# Patient Record
Sex: Male | Born: 1935 | Race: White | Marital: Married | Smoking: Never smoker
Health system: Southern US, Community
[De-identification: ages and names within clinical notes are randomized; demographics above are authoritative.]

## PROBLEM LIST (undated history)

## (undated) DIAGNOSIS — F329 Major depressive disorder, single episode, unspecified: Secondary | ICD-10-CM

## (undated) DIAGNOSIS — G20 Parkinson's disease: Secondary | ICD-10-CM

## (undated) DIAGNOSIS — F32A Depression, unspecified: Secondary | ICD-10-CM

## (undated) DIAGNOSIS — Z638 Other specified problems related to primary support group: Secondary | ICD-10-CM

## (undated) DIAGNOSIS — L219 Seborrheic dermatitis, unspecified: Secondary | ICD-10-CM

## (undated) DIAGNOSIS — G3184 Mild cognitive impairment, so stated: Secondary | ICD-10-CM

## (undated) DIAGNOSIS — R269 Unspecified abnormalities of gait and mobility: Secondary | ICD-10-CM

## (undated) DIAGNOSIS — I951 Orthostatic hypotension: Secondary | ICD-10-CM

## (undated) DIAGNOSIS — E785 Hyperlipidemia, unspecified: Secondary | ICD-10-CM

## (undated) DIAGNOSIS — Z9289 Personal history of other medical treatment: Secondary | ICD-10-CM

## (undated) DIAGNOSIS — F419 Anxiety disorder, unspecified: Secondary | ICD-10-CM

## (undated) DIAGNOSIS — H6120 Impacted cerumen, unspecified ear: Secondary | ICD-10-CM

## (undated) DIAGNOSIS — I10 Essential (primary) hypertension: Secondary | ICD-10-CM

## (undated) DIAGNOSIS — M1990 Unspecified osteoarthritis, unspecified site: Secondary | ICD-10-CM

## (undated) DIAGNOSIS — K219 Gastro-esophageal reflux disease without esophagitis: Secondary | ICD-10-CM

## (undated) DIAGNOSIS — H409 Unspecified glaucoma: Secondary | ICD-10-CM

## (undated) DIAGNOSIS — G20A1 Parkinson's disease without dyskinesia, without mention of fluctuations: Secondary | ICD-10-CM

## (undated) DIAGNOSIS — J189 Pneumonia, unspecified organism: Secondary | ICD-10-CM

## (undated) HISTORY — PX: INGUINAL HERNIA REPAIR: SUR1180

## (undated) HISTORY — DX: Unspecified abnormalities of gait and mobility: R26.9

## (undated) HISTORY — DX: Parkinson's disease without dyskinesia, without mention of fluctuations: G20.A1

## (undated) HISTORY — DX: Hyperlipidemia, unspecified: E78.5

## (undated) HISTORY — DX: Impacted cerumen, unspecified ear: H61.20

## (undated) HISTORY — DX: Orthostatic hypotension: I95.1

## (undated) HISTORY — PX: CATARACT EXTRACTION W/ INTRAOCULAR LENS  IMPLANT, BILATERAL: SHX1307

## (undated) HISTORY — DX: Mild cognitive impairment of uncertain or unknown etiology: G31.84

## (undated) HISTORY — DX: Seborrheic dermatitis, unspecified: L21.9

## (undated) HISTORY — DX: Unspecified glaucoma: H40.9

## (undated) HISTORY — PX: JOINT REPLACEMENT: SHX530

## (undated) HISTORY — DX: Parkinson's disease: G20

## (undated) HISTORY — PX: TOTAL KNEE ARTHROPLASTY: SHX125

## (undated) HISTORY — PX: TONSILLECTOMY AND ADENOIDECTOMY: SUR1326

## (undated) HISTORY — DX: Other specified problems related to primary support group: Z63.8

---

## 2012-12-17 ENCOUNTER — Other Ambulatory Visit (HOSPITAL_COMMUNITY): Payer: Self-pay

## 2012-12-17 DIAGNOSIS — R1310 Dysphagia, unspecified: Secondary | ICD-10-CM

## 2012-12-19 ENCOUNTER — Ambulatory Visit (HOSPITAL_COMMUNITY)
Admission: RE | Admit: 2012-12-19 | Discharge: 2012-12-19 | Disposition: A | Discharge status: Home / Self Care | Payer: Medicare PPO | Source: Ambulatory Visit | Attending: Psychiatry | Admitting: Psychiatry

## 2012-12-19 DIAGNOSIS — G20A1 Parkinson's disease without dyskinesia, without mention of fluctuations: Secondary | ICD-10-CM | POA: Insufficient documentation

## 2012-12-19 DIAGNOSIS — G20 Parkinson's disease: Secondary | ICD-10-CM | POA: Insufficient documentation

## 2012-12-19 DIAGNOSIS — R1313 Dysphagia, pharyngeal phase: Secondary | ICD-10-CM | POA: Insufficient documentation

## 2012-12-19 DIAGNOSIS — R059 Cough, unspecified: Secondary | ICD-10-CM | POA: Insufficient documentation

## 2012-12-19 DIAGNOSIS — R05 Cough: Secondary | ICD-10-CM | POA: Insufficient documentation

## 2012-12-19 DIAGNOSIS — R1311 Dysphagia, oral phase: Secondary | ICD-10-CM | POA: Insufficient documentation

## 2012-12-19 DIAGNOSIS — R1310 Dysphagia, unspecified: Secondary | ICD-10-CM

## 2012-12-20 NOTE — Procedures (Signed)
Objective Swallowing Evaluation: Modified Barium Swallowing Study : Late Entry Patient Details  Name: Alexander Holder MRN: [id number] Date of Birth: 16-Jan-1922  Today's Date: 12/20/2012 Time: 1015-1045 SLP Time Calculation (min): 30 min  Past Medical History: No past medical history on file. Past Surgical History: No past surgical history on file. HPI:  Pt is an 79 year old male with a history of parkinson's. He arrives with his daughter. Pt and daughter report occasional cough. He also reports needing to tilt head back to take pills. Hes acknowlegdges that he has been fatigued at end of day resulting in increased dysarthria and memory loss.      Assessment / Plan / Recommendation Clinical Impression  Dysphagia Diagnosis: Mild oral phase dysphagia;Mild pharyngeal phase dysphagia Clinical impression: Pt demonstrates mild oral and pharyngeal weakness resulting in decreased oral propulsion of bolus and mildly impaired pharyngeal peristalsis. There were no observed episodes of aspiration, however, pt did penetrate only when attempting a chin tuck. Base of tongue and weakness of hyolaryngeal mechanism lead to mild to moderate oral and pharyngeal residuals requiring min verbal cues for a second swallow. Pt was also recommended to consume pills in puree to facilitate oral coordination and reduce need for posterior head tilt. Further recommend pt continue a regular diet and thin liquids. Consider avoiding fatigue by eating 5 small meals a day, particularly after resting. Advised pts daughter to be aware of progressive nature of Parkinson's and dysphagia with increased risk with any acute illness or decline in health.     Treatment Recommendation  No treatment recommended at this time    Diet Recommendation Regular;Thin liquid   Liquid Administration via: Cup;Straw Medication Administration: Whole meds with puree Supervision: Patient able to self feed Compensations: Slow rate;Small  sips/bites;Multiple dry swallows after each bite/sip;Follow solids with liquid Postural Changes and/or Swallow Maneuvers: Seated upright 90 degrees;Upright 30-60 min after meal    Other  Recommendations Oral Care Recommendations: Patient independent with oral care   Follow Up Recommendations  None    Frequency and Duration        Pertinent Vitals/Pain NA    SLP Swallow Goals     General HPI: Pt is an 79 year old male with a history of parkinson's. He arriaves with his daughter. Pt and daughter report occasional cough. He also reports needing to tilt head back to take pills. Hes acknowlegdges that he has been fatigued at end of day resulting in increased dysarthria and memory loss.  Type of Study: Modified Barium Swallowing Study Reason for Referral: Objectively evaluate swallowing function Diet Prior to this Study: Regular;Thin liquids Temperature Spikes Noted: [name] Respiratory Status: Room air History of Recent Intubation: No Behavior/Cognition: Alert;Cooperative;Pleasant mood Oral Cavity - Dentition: Adequate natural dentition Oral Motor / Sensory Function: Impaired motor (remulous lingual movement) Self-Feeding Abilities: Able to feed self Patient Positioning: Upright in chair Baseline Vocal Quality: Low vocal intensity Volitional Cough: Strong Volitional Swallow: Able to elicit Anatomy: Within functional limits Pharyngeal Secretions: Not observed secondary MBS    Reason for Referral Objectively evaluate swallowing function   Oral Phase Oral Preparation/Oral Phase Oral Phase: Impaired Oral - Thin Oral - Thin Cup: Reduced posterior propulsion Oral - Thin Straw: Reduced posterior propulsion Oral - Solids Oral - Puree: Reduced posterior propulsion Oral - Regular: Reduced posterior propulsion Oral - Pill: Reduced posterior propulsion   Pharyngeal Phase Pharyngeal Phase Pharyngeal Phase: Impaired Pharyngeal - Thin Pharyngeal - Thin Cup: Premature spillage to pyriform  sinuses;Delayed swallow initiation;Reduced pharyngeal peristalsis;Reduced epiglottic inversion;Reduced  anterior laryngeal mobility;Reduced laryngeal elevation;Reduced tongue base retraction;Penetration/Aspiration before swallow;Pharyngeal residue - valleculae;Pharyngeal residue - pyriform sinuses (penetration only occured with a chin tuck) Penetration/Aspiration details (thin cup): Material does not enter airway;Material enters airway, CONTACTS cords then ejected out Pharyngeal - Thin Straw: Delayed swallow initiation;Premature spillage to pyriform sinuses;Reduced pharyngeal peristalsis;Reduced epiglottic inversion;Reduced anterior laryngeal mobility;Reduced laryngeal elevation;Reduced tongue base retraction Pharyngeal - Solids Pharyngeal - Puree: Delayed swallow initiation;Pharyngeal residue - valleculae;Pharyngeal residue - pyriform sinuses;Reduced pharyngeal peristalsis;Reduced epiglottic inversion;Reduced anterior laryngeal mobility;Reduced laryngeal elevation;Reduced tongue base retraction Pharyngeal - Regular: Delayed swallow initiation;Reduced pharyngeal peristalsis;Reduced epiglottic inversion;Reduced anterior laryngeal mobility;Reduced laryngeal elevation;Reduced tongue base retraction;Pharyngeal residue - valleculae;Pharyngeal residue - pyriform sinuses  Cervical Esophageal Phase    GO    Cervical Esophageal Phase Cervical Esophageal Phase: Impaired Cervical Esophageal Phase - Comment Cervical Esophageal Comment: appearance of stasis throughout esophageal column, particularly with pureed solids.     Functional Assessment Tool Used: clinical judgement Functional Limitations: Swallowing Swallow Current Status (G9562): At least 20 percent but less than 40 percent impaired, limited or restricted Swallow Goal Status [phone]): At least 20 percent but less than 40 percent impaired, limited or restricted Swallow Discharge Status [phone]): At least 20 percent but less than 40 percent impaired,  limited or restricted   [facility], MA CCC-SLP [phone]  Claudine Mouton 12/20/2012, 3:28 PM

## 2013-04-08 ENCOUNTER — Encounter: Payer: Self-pay | Admitting: Neurology

## 2013-04-08 ENCOUNTER — Ambulatory Visit (INDEPENDENT_AMBULATORY_CARE_PROVIDER_SITE_OTHER): Payer: Medicare PPO | Admitting: Neurology

## 2013-04-08 ENCOUNTER — Encounter (INDEPENDENT_AMBULATORY_CARE_PROVIDER_SITE_OTHER): Payer: Self-pay

## 2013-04-08 VITALS — BP 150/74 | HR 57 | Ht 64.0 in | Wt 142.0 lb

## 2013-04-08 DIAGNOSIS — G20 Parkinson's disease: Secondary | ICD-10-CM

## 2013-04-08 DIAGNOSIS — G20A1 Parkinson's disease without dyskinesia, without mention of fluctuations: Secondary | ICD-10-CM

## 2013-04-08 DIAGNOSIS — W19XXXA Unspecified fall, initial encounter: Secondary | ICD-10-CM

## 2013-04-08 DIAGNOSIS — F0390 Unspecified dementia without behavioral disturbance: Secondary | ICD-10-CM

## 2013-04-08 MED ORDER — CARBIDOPA-LEVODOPA 25-250 MG PO TABS
ORAL_TABLET | ORAL | Status: DC
Start: 2013-04-08 — End: 2013-07-09

## 2013-04-08 NOTE — Progress Notes (Signed)
Subjective:    Patient ID: Alexander Holder is a 79 y.o. male.  HPI    Huston Foley, MD, PhD [facility] [address], [address] [address] [place], [place] [address]  Dear Dr. Smith Mince,   I saw your patient, Alexander Holder, upon your kind request in my neurologic clinic today for initial consultation of his parkinsonism. The patient is accompanied by his daughter, Alexander Holder, today. As you know, Alexander Holder is a very pleasant 79 year old right-handed gentleman with an underlying medical history of bradycardia, dementia, glaucoma, hyperlipidemia, who has been followed by neurologist in [place] for Parkinson's disease. He has been on Sinemet. He has been following him for his chronic conditions including dementia without behavioral disturbance, bradycardia, orthostatic hypotension in the context of Parkinson's disease, edema. He is at fall risk and has fallen in the past. His prior neurologist left. He is currently on Sinemet 25-250 mg strength one pill 5 times a day for a total of 1250 mg of levodopa. I also reviewed his prior neurological records from [facility] [place]. He used to see Alexander Holder. I reviewed her office visits from 04/15/2012, 06/11/2012, 09/30/2012, and 01/15/2013. His latest blood pressure was 97/49 at that visit. He was increased from qid to five times a day. He stays at [facility] since March 2014 and he moved from [place], [place]. His symptoms date back to 2000 and started with tremor and gait disturbance. He has always been Sinemet and was on Seroquel in the past for RBD, but stopped it in February 2014. He has not been on any medication for his OH. He has had VH in the past. He has had some memory but never was on dementia medication. He is now taking C/L 25/250 mg 1 pill 5 x / day: 7, 10, 1 PM, 4 PM and 8 PM. He reports no SEs and feels it is still working well for him. He gets his medications administered  by the staff at [facility]. His wife has dementia and stays at [facility].  Both daughters ([place] and Elizabeth/Betsy) have POA. He fell today and sustained carpet burns on his knees, but did hit his head. No loss of consciousness. He denies HA, CP or SOB, no lethargy and daughter reports no change in personality, no lethargy.  He has been using a 2 wheeled walker since last year and was tried on a U step walker, but could not use the reverse brakes.   His Past Medical History Is Significant For: Past Medical History  Diagnosis Date  . Paralysis agitans   . Unspecified glaucoma(365.9)   . Orthostatic hypotension   . Other dysphagia   . Other specified cardiac dysrhythmias(427.89)   . Other and unspecified hyperlipidemia   . Edema   . Abnormality of gait   . Seborrheic dermatitis, unspecified   . Impacted cerumen   . Mild cognitive impairment, so stated   . Other family disruption     Wife in ALF with dementia    His Past Surgical History Is Significant For: No past surgical history on file.  His Family History Is Significant For: Family History  Problem Relation Age of Onset  . Cancer Brother   . Diabetes Mother     His Social History Is Significant For: History   Social History  . Marital Status: Married    Spouse Name: [name]    Number of Children: [name]  . Years of Education: [name]   Social History  Main Topics  . Smoking status: Never Smoker   . Smokeless tobacco: None  . Alcohol Use: No  . Drug Use: No  . Sexual Activity: None   Other Topics Concern  . None   Social History Narrative  . None    His Allergies Are:  No Known Allergies:   His Current Medications Are:  Outpatient Encounter Prescriptions as of 04/08/2013  Medication Sig  . aspirin 81 MG tablet Take 81 mg by mouth daily.  . carbidopa-levodopa (SINEMET IR) 25-250 MG per tablet Take 1 tablet by mouth 3 (three) times daily.   . dorzolamide-timolol (COSOPT) 22.3-6.8 MG/ML  ophthalmic solution   . fish oil-omega-3 fatty acids 1000 MG capsule Take 1,000 mg by mouth daily.  Marland Kitchen FOLIC ACID PO Take 0.4 mg by mouth daily.  Marland Kitchen LUMIGAN 0.01 % SOLN   . Multiple Vitamins-Minerals (MULTIVITAMIN PO) Take 1 tablet by mouth daily.  . niacin 500 MG tablet Take 500 mg by mouth daily.   Review of Systems:  Out of a complete 14 point review of systems, all are reviewed and negative with the exception of these symptoms as listed below:  Review of Systems  Constitutional: Negative.   HENT: Negative.   Eyes: Positive for visual disturbance (diplopia).  Respiratory: Positive for shortness of breath.   Cardiovascular: Negative.   Gastrointestinal: Negative.   Genitourinary:       Incontinence  Musculoskeletal: Positive for arthralgias.  Skin: Negative.   Allergic/Immunologic: Negative.   Neurological: Positive for tremors.  Hematological: Negative.   Psychiatric/Behavioral: Positive for sleep disturbance.    Objective:  Neurologic Exam  Physical Exam Physical Examination:   Filed Vitals:   04/08/13 1314  BP: 150/74  Pulse: 57    General Examination: The patient is a very pleasant 79 y.o. male in no acute distress. He looks frail and mildly deconditioned. He has mild temporal wasting.  HEENT: Normocephalic, atraumatic, pupils are equal, round and reactive to light and accommodation. Extraocular tracking shows moderate saccadic breakdown without nystagmus noted. There is limitation to upper gaze. There is moderate decrease in eye blink rate. Hearing is not impaired. Face is symmetric with moderate facial masking and normal facial sensation. There is no lip, neck or jaw tremor. Neck is moderately to severely rigid with decreased passive ROM. There are no carotid bruits on auscultation. Oropharynx exam reveals mild mouth dryness. No significant airway crowding is noted. Mallampati is class I. Tongue protrudes centrally and palate elevates symmetrically.   There is no  drooling.   Chest: is clear to auscultation without wheezing, rhonchi or crackles noted.  Heart: sounds are regular and normal without murmurs, rubs or gallops noted.   Abdomen: is soft, non-tender and non-distended with normal bowel sounds appreciated on auscultation.  Extremities: There is 1+ pitting edema in the distal lower extremities bilaterally. Pedal pulses are intact. Chronic stasis-like changes are noted in the distal legs bilaterally, R more than L. There are no varicose veins. He has mild redness over the left knee consistent with a carpet burn. He has no other injuries or lacerations or swelling.  Skin: is warm and dry with no trophic changes noted. Age-related changes are noted on the skin.   Musculoskeletal: exam reveals no obvious joint deformities, tenderness, joint swelling or erythema.  Neurologically:  Mental status: The patient is awake and alert, paying good  attention. He is able to partially provide the history. His daughter provides most of the entire history. He is oriented to: person, situation,  month of year and year. His memory, attention, language and knowledge are impaired. There is no aphasia, agnosia, apraxia or anomia. There is a moderate degree of bradyphrenia. Speech is moderately hypophonic with mild dysarthria noted. Mood is congruent and affect is normal.  Cranial nerves are as described above under HEENT exam. In addition, shoulder shrug is normal with equal shoulder height noted.  Motor exam: Normal bulk, and strength for age is noted. There are no dyskinesias noted.  Tone is moderately rigid with presence of cogwheeling in the bilateral upper extremities. There is overall moderate bradykinesia. There is no drift or rebound.  There is no tremor.   Romberg is not tested d/t fall risk.  Reflexes are 1+ in the upper extremities and 1+ in the left knee, otherwise absent in the lower extremities.   Fine motor skills exam: Finger taps are moderately impaired  on the right and severely impaired on the left. Hand movements are mildly impaired on the right and moderately impaired on the left. RAP (rapid alternating patting) is mildly impaired on the right and moderately impaired on the left. Foot taps are moderately impaired on the right and severely impaired on the left. Foot agility (in the form of heel stomping) is moderately impaired on the right and moderately impaired on the left.    Cerebellar testing shows no dysmetria or intention tremor on finger to nose testing. Heel to shin is unremarkable bilaterally. There is no truncal or gait ataxia.   Sensory exam is intact to light touch, pinprick, vibration, temperature sense and proprioception in the upper and lower extremities.   Gait, station and balance: He stands up from the seated position with moderate difficulty and needs to push up with His hands. He needs no assistance. No veering to one side is noted. He is not noted to lean to the side. Posture is moderately stooped. Stance is wide-based. He walks with decrease in stride length and pace and maneuvers his 2 wheeled walker well. He does have trouble turning and has mild hesitation while turning. His last medication dose by the way was around 10 AM today. He has mild to moderately impaired balance.   Assessment and Plan:   In summary, Aleksandar Duve is a very pleasant 79 y.o.-year old male with a history of advanced Parkinson's disease, left-sided predominant, complicated by memory loss, history of hallucinations, history of RBD and orthostatic hypotension. His physical exam is consistent with left-sided predominant findings and advanced PD. I had a long chat with the patient and his daughter regarding his symptoms and my findings and the diagnosis of advanced Parkinson's disease, its prognosis and treatment options as well as limitations as we go into the future. We have to consider medication side effect, we have to consider his age and progression.  As time goes by he can develop dyskinesias and more problems with orthostatic hypotension and medication side effects including dyskinesias. We have to be very mindful of this and be cautious with any medication changes. To that and, at this time I would not like to make any changes to his medication regimen. He fell today but denies any headache, one-sided weakness, numbness or blurry vision and he has had no loss of consciousness and no changes in personality or consciousness level. However, we will have a low threshold of doing a head CT if the need arises. I have asked his daughter to be mindful of any new neurological changes or changes in mental status. She was in agreement. I  renewed his Sinemet prescription. I would like to see him back in 3 months from now, sooner if the need arises. We talked about maintaining a healthy lifestyle in general. I asked the patient to use Ensure as a supplement. I encouraged the patient to eat healthy, exercise daily and keep well hydrated, to keep a scheduled bedtime and wake time routine, to not skip any meals and eat healthy snacks in between meals and to have protein with every meal. In particular, I stressed the importance of regular exercise, within of course the patient's own mobility limitations.   As far as further diagnostic testing is concerned, I suggested: no change.  As far as medications are concerned, I recommended the following at this time: no change.  I answered all their questions today and the patient and his daughter were in agreement with the above outlined plan. I would like to see the patient back in 3 months, sooner if the need arises and encouraged them to call with any interim questions, concerns, problems or updates and refill requests.   Thank you very much for allowing me to participate in the care of this nice patient. If I can be of any further assistance to you please do not hesitate to call me at [phone].  Sincerely,   Huston Foley, MD, PhD

## 2013-04-08 NOTE — Patient Instructions (Addendum)
I think overall you are doing fairly well but I do want to suggest a few things today:  Remember to drink plenty of fluid, eat healthy meals and do not skip any meals. Try to eat protein with a every meal and eat a healthy snack such as fruit or nuts in between meals. Try to keep a regular sleep-wake schedule and try to exercise daily, particularly in the form of walking, 20-30 minutes a day, if you can.   Engage in social activities in your community and with your family and try to keep up with current events by reading the newspaper or watching the news.   As far as your medications are concerned, I would like to suggest no changes today.   As far as diagnostic testing: no new test today. But since you fell today and hit your head, we will have a low threshold to scan your brain with a CT. If you have new onset headache, lethargy, change in personality or one sided weakness or numbness, we will order a brain CT.   I would like to see you back in 3 months, sooner if we need to. Please call us with any interim questions, concerns, problems, updates or refill requests.  Please also call us for any test results so we can go over those with you on the phone. Brett Canales is my clinical assistant and will answer any of your questions and relay your messages to me and also relay most of my messages to you.  Our phone number is [phone]. We also have an after hours call service for urgent matters and there is a physician on-call for urgent questions. For any emergencies you know to call 911 or go to the nearest emergency room.

## 2013-07-09 ENCOUNTER — Encounter (HOSPITAL_COMMUNITY): Payer: Self-pay | Admitting: Emergency Medicine

## 2013-07-09 ENCOUNTER — Emergency Department (HOSPITAL_COMMUNITY)
Admission: EM | Admit: 2013-07-09 | Discharge: 2013-07-09 | Disposition: A | Discharge status: Home / Self Care | Payer: Medicare PPO | Attending: Emergency Medicine | Admitting: Emergency Medicine

## 2013-07-09 ENCOUNTER — Emergency Department (HOSPITAL_COMMUNITY): Payer: Medicare PPO

## 2013-07-09 DIAGNOSIS — G20A1 Parkinson's disease without dyskinesia, without mention of fluctuations: Secondary | ICD-10-CM | POA: Insufficient documentation

## 2013-07-09 DIAGNOSIS — R319 Hematuria, unspecified: Secondary | ICD-10-CM

## 2013-07-09 DIAGNOSIS — G20 Parkinson's disease: Secondary | ICD-10-CM | POA: Insufficient documentation

## 2013-07-09 DIAGNOSIS — Z79899 Other long term (current) drug therapy: Secondary | ICD-10-CM | POA: Insufficient documentation

## 2013-07-09 DIAGNOSIS — H409 Unspecified glaucoma: Secondary | ICD-10-CM | POA: Insufficient documentation

## 2013-07-09 DIAGNOSIS — Z7982 Long term (current) use of aspirin: Secondary | ICD-10-CM | POA: Insufficient documentation

## 2013-07-09 DIAGNOSIS — Z872 Personal history of diseases of the skin and subcutaneous tissue: Secondary | ICD-10-CM | POA: Insufficient documentation

## 2013-07-09 DIAGNOSIS — E785 Hyperlipidemia, unspecified: Secondary | ICD-10-CM | POA: Insufficient documentation

## 2013-07-09 DIAGNOSIS — N2889 Other specified disorders of kidney and ureter: Secondary | ICD-10-CM

## 2013-07-09 DIAGNOSIS — N289 Disorder of kidney and ureter, unspecified: Secondary | ICD-10-CM | POA: Insufficient documentation

## 2013-07-09 DIAGNOSIS — N390 Urinary tract infection, site not specified: Secondary | ICD-10-CM | POA: Insufficient documentation

## 2013-07-09 DIAGNOSIS — Z8679 Personal history of other diseases of the circulatory system: Secondary | ICD-10-CM | POA: Insufficient documentation

## 2013-07-09 HISTORY — DX: Parkinson's disease: G20

## 2013-07-09 HISTORY — DX: Parkinson's disease without dyskinesia, without mention of fluctuations: G20.A1

## 2013-07-09 LAB — URINALYSIS, ROUTINE W REFLEX MICROSCOPIC
BILIRUBIN URINE: NEGATIVE
Glucose, UA: NEGATIVE mg/dL
KETONES UR: NEGATIVE mg/dL
NITRITE: POSITIVE — AB
PH: 7.5 (ref 5.0–8.0)
PROTEIN: NEGATIVE mg/dL
Specific Gravity, Urine: 1.018 (ref 1.005–1.030)
UROBILINOGEN UA: 0.2 mg/dL (ref 0.0–1.0)

## 2013-07-09 LAB — URINE MICROSCOPIC-ADD ON

## 2013-07-09 LAB — CBC WITH DIFFERENTIAL/PLATELET
BASOS ABS: 0 10*3/uL (ref 0.0–0.1)
BASOS PCT: 0 % (ref 0–1)
Eosinophils Absolute: 0.4 10*3/uL (ref 0.0–0.7)
Eosinophils Relative: 5 % (ref 0–5)
HCT: 36.7 % — ABNORMAL LOW (ref 39.0–52.0)
HEMOGLOBIN: 12.2 g/dL — AB (ref 13.0–17.0)
Lymphocytes Relative: 19 % (ref 12–46)
Lymphs Abs: 1.4 10*3/uL (ref 0.7–4.0)
MCH: 31.7 pg (ref 26.0–34.0)
MCHC: 33.2 g/dL (ref 30.0–36.0)
MCV: 95.3 fL (ref 78.0–100.0)
MONO ABS: 0.5 10*3/uL (ref 0.1–1.0)
Monocytes Relative: 7 % (ref 3–12)
NEUTROS PCT: 69 % (ref 43–77)
Neutro Abs: 5 10*3/uL (ref 1.7–7.7)
Platelets: 216 10*3/uL (ref 150–400)
RBC: 3.85 MIL/uL — ABNORMAL LOW (ref 4.22–5.81)
RDW: 14.1 % (ref 11.5–15.5)
WBC: 7.2 10*3/uL (ref 4.0–10.5)

## 2013-07-09 LAB — COMPREHENSIVE METABOLIC PANEL
ALBUMIN: 3 g/dL — AB (ref 3.5–5.2)
ALT: 19 U/L (ref 0–53)
AST: 26 U/L (ref 0–37)
Alkaline Phosphatase: 69 U/L (ref 39–117)
BUN: 21 mg/dL (ref 6–23)
CO2: 26 mEq/L (ref 19–32)
Calcium: 9.1 mg/dL (ref 8.4–10.5)
Chloride: 102 mEq/L (ref 96–112)
Creatinine, Ser: 0.87 mg/dL (ref 0.50–1.35)
GFR calc Af Amer: 86 mL/min — ABNORMAL LOW (ref >90)
GFR calc non Af Amer: 74 mL/min — ABNORMAL LOW (ref >90)
Glucose, Bld: 84 mg/dL (ref 70–99)
POTASSIUM: 4.3 meq/L (ref 3.7–5.3)
Sodium: 138 mEq/L (ref 137–147)
Total Bilirubin: 0.4 mg/dL (ref 0.3–1.2)
Total Protein: 6.4 g/dL (ref 6.0–8.3)

## 2013-07-09 LAB — PROTIME-INR
INR: 1.06 (ref 0.00–1.49)
PROTHROMBIN TIME: 13.6 s (ref 11.6–15.2)

## 2013-07-09 LAB — APTT: APTT: 31 s (ref 24–37)

## 2013-07-09 MED ORDER — CEPHALEXIN 500 MG PO CAPS
500.0000 mg | ORAL_CAPSULE | Freq: Two times a day (BID) | ORAL | Status: DC
Start: 2013-07-09 — End: 2013-10-28

## 2013-07-09 NOTE — ED Notes (Signed)
PTAR called to [place] [facility].  Found patient in bed non ambulatory. Patient was being helped by a CNA using a urinal and the noticed that he had blood  In his urine.  Patient is unable to give anything other than "yes / no" answers to questions Due to history or parkinson's

## 2013-07-09 NOTE — ED Notes (Signed)
Patient given urinal and informed about sample needed.

## 2013-07-09 NOTE — ED Provider Notes (Signed)
10:05 AM Care transferred to me. Patient's CT shows concerning renal mass in setting of hematuria. Hgb 12. Cr normal. Discussed the mass with both patient and daughter at bedside. They already have their PCP setting up urology appt due to hematuria prior to ED arrival, and they will call PCP for outpatient mass w/u. Tech noted patient urinated earlier w/o giving sample. When he tried to urinate again only a small amount came out and was bloody. Given this a cath was performed for a sample, shows a UTI. Will treat with keflex. Recommended close PCP f/u.  Audree CamelScott T Ashlee Bewley, MD 07/09/13 1009

## 2013-07-09 NOTE — ED Notes (Signed)
Patient out of room I will collect labs when he returns

## 2013-07-09 NOTE — ED Provider Notes (Signed)
CSN: [id number]     Arrival date & time 07/09/13  4098 History   First MD Initiated Contact with Patient 07/09/13 0355     Chief Complaint  Patient presents with  . Hematuria     (Consider location/radiation/quality/duration/timing/severity/associated sxs/prior Treatment) HPI Patient was sent to the emergency department by his nursing facility after the technician who is helping him urinate this morning noted there were some blood in his urine. Patient denies any abdominal pain. He denies any dysuria, fever, chills, frequency, nausea or vomiting. Patient states he feels totally normal. He relates he thinks about 2 weeks ago he had blood in his urine and was treated with antibiotics for an infection. However when nursing staff called his daughter she thought it was more like 2 months ago. We attempted to call his nursing facility to get further details however there was no answer.  PCP Dr Smith Mince Neurologist Dr. Marjory Lies  Past Medical History  Diagnosis Date  . Paralysis agitans   . Unspecified glaucoma   . Orthostatic hypotension   . Other dysphagia   . Other specified cardiac dysrhythmias(427.89)   . Other and unspecified hyperlipidemia   . Edema   . Abnormality of gait   . Seborrheic dermatitis, unspecified   . Impacted cerumen   . Mild cognitive impairment, so stated   . Other family disruption     Wife in ALF with dementia  . Parkinson disease    History reviewed. No pertinent past surgical history. Family History  Problem Relation Age of Onset  . Cancer Brother   . Diabetes Mother    History  Substance Use Topics  . Smoking status: Never Smoker   . Smokeless tobacco: Not on file  . Alcohol Use: No  lives in NH  Review of Systems  All other systems reviewed and are negative.      Allergies  Review of patient's allergies indicates no known allergies.  Home Medications   Current Outpatient Rx  Name  Route  Sig  Dispense  Refill  . aspirin 81 MG  tablet   Oral   Take 81 mg by mouth daily.         . bimatoprost (LUMIGAN) 0.01 % SOLN   Right Eye   Place 1 drop into the right eye at bedtime.         . carbidopa-levodopa (SINEMET IR) 25-250 MG per tablet   Oral   Take 1 tablet by mouth every 4 (four) hours. Pt takes at 7 am, 10 am, 2 pm, 4:30 pm, and 8 pm         . donepezil (ARICEPT) 5 MG tablet   Oral   Take 5 mg by mouth at bedtime.         . dorzolamide-timolol (COSOPT) 22.3-6.8 MG/ML ophthalmic solution   Both Eyes   Place 1 drop into both eyes every 12 (twelve) hours.          . Ergocalciferol (VITAMIN D2) 2000 UNITS TABS   Oral   Take 2,000 Units by mouth daily.         . folic acid (FOLVITE) 800 MCG tablet   Oral   Take 800 mcg by mouth daily.         . furosemide (LASIX) 20 MG tablet   Oral   Take 20 mg by mouth daily as needed for fluid.         Marland Kitchen ibuprofen (ADVIL,MOTRIN) 200 MG tablet   Oral   Take 200 mg  by mouth every 6 (six) hours as needed for mild pain.         . Multiple Vitamins-Minerals (MULTIVITAMIN PO)   Oral   Take 1 tablet by mouth daily.         . niacin 500 MG tablet   Oral   Take 500 mg by mouth daily.         . Omega-3 Fatty Acids (FISH OIL) 1200 MG CAPS   Oral   Take 1,200 mg by mouth daily.          BP 156/81  Pulse 55  Temp(Src) 98 F (36.7 C) (Oral)  Resp 18  SpO2 95%  Vital signs normal except for bradycardia  Physical Exam  Nursing note and vitals reviewed. Constitutional: He is oriented to person, place, and time. He appears well-developed and well-nourished.  Non-toxic appearance. He does not appear ill. No distress.  HENT:  Head: Normocephalic and atraumatic.  Right Ear: External ear normal.  Left Ear: External ear normal.  Nose: Nose normal. No mucosal edema or rhinorrhea.  Mouth/Throat: Oropharynx is clear and moist and mucous membranes are normal. No dental abscesses or uvula swelling.  Eyes: Conjunctivae and EOM are normal. Pupils  are equal, round, and reactive to light.  Neck: Normal range of motion and full passive range of motion without pain. Neck supple.  Cardiovascular: Normal rate, regular rhythm and normal heart sounds.  Exam reveals no gallop and no friction rub.   No murmur heard. Pulmonary/Chest: Effort normal and breath sounds normal. No respiratory distress. He has no wheezes. He has no rhonchi. He has no rales. He exhibits no tenderness and no crepitus.  Abdominal: Soft. Normal appearance and bowel sounds are normal. He exhibits no distension. There is no tenderness. There is no rebound and no guarding.  Musculoskeletal: Normal range of motion. He exhibits no edema and no tenderness.  Moves all extremities well.   Neurological: He is alert and oriented to person, place, and time. He has normal strength. No cranial nerve deficit.  Flat facies, slow movement, slow speech  Skin: Skin is warm, dry and intact. No rash noted. No erythema. There is pallor.  Psychiatric: His speech is normal and behavior is normal. His mood appears not anxious.  Flat affect    ED Course  Procedures (including critical care time)  Pt given IV fluids and oral fluids, waiting for UA at change of shift. Labs and CT added to complete evaluation. Cath urine not done b/o concern of hematuria from cathiter insertion  07:40 pt left with Dr Criss AlvineGoldston at change of shift to get test results and decide disposition  Labs Review  pending   Imaging Review  pending  EKG Interpretation   None       MDM   Final diagnoses:  Hematuria    disposition pending per Dr Quintin AltoGoldston   Alexander Korber, MD, Franz DellFACEP     Alexander Holder L Somaly Marteney, MD 07/09/13 [phone]

## 2013-07-12 LAB — URINE CULTURE: Colony Count: >=100000

## 2013-07-13 ENCOUNTER — Telehealth (HOSPITAL_COMMUNITY): Payer: Self-pay | Admitting: Emergency Medicine

## 2013-07-13 NOTE — Progress Notes (Signed)
ED Antimicrobial Stewardship Positive Culture Follow Up   Alexander PaneRussell Holder is an 80 y.o. male who [facility] with a chief complaint of  Chief Complaint  Patient presents with  . Hematuria    Recent Results (from the past 720 hour(s))  URINE CULTURE     Status: None   Collection Time    07/09/13  9:18 AM      Result Value Ref Range Status   Specimen Description URINE, CATHETERIZED   Final   Special Requests NONE   Final   Culture  Setup Time     Final   Value: 07/09/2013 14:12     Performed at [facility]   Colony Count     Final   Value: >=100,000 COLONIES/ML     Performed at [facility]   Culture     Final   Value: PSEUDOMONAS AERUGINOSA     Performed at [facility]   Report Status 07/12/2013 FINAL   Final   Organism ID, Bacteria PSEUDOMONAS AERUGINOSA   Final    [x]  Treated with Keflex, organism resistant to prescribed antimicrobial  Pt was sent home on keflex for UTI. Culture now came back with pseudomonas. Will treat with fosfomucin.  New antibiotic prescription:  Start Fosfomycin 3g PO q72h x 2 doses  ED Provider: Louis MeckelKelly Humer, PA  Ulyses SouthwardMinh Cobi Aldape, PharmD Pager: [phone] Infectious Diseases Pharmacist Phone# [phone]

## 2013-07-13 NOTE — ED Notes (Signed)
Post ED Visit - Positive Culture Follow-up: Successful Patient Follow-Up  Culture assessed and recommendations reviewed by: []  Wes Dulaney, Pharm.D., BCPS []  Celedonio MiyamotoJeremy Frens, Pharm.D., BCPS []  Georgina PillionElizabeth Martin, Pharm.D., BCPS [x]  Burns FlatMinh Pham, [address] Rainbow BoulevardPharm.D., BCPS, AAHIVP []  Estella HuskMichelle Turner, Pharm.D., BCPS, AAHIVP  Positive urine culture  []  Patient discharged without antimicrobial prescription and treatment is now indicated [x]  Organism is resistant to prescribed ED discharge antimicrobial []  Patient with positive blood cultures  Changes discussed with ED provider: Antony MaduraKelly Humes PA-C New antibiotic prescription: Fosfomycin 3 gm q 72 h x 2 doses    Alexander Holder 07/13/2013, 5:41 PM

## 2013-07-14 NOTE — ED Notes (Signed)
Patient followed up with [facility]

## 2013-07-16 ENCOUNTER — Other Ambulatory Visit (HOSPITAL_COMMUNITY): Payer: Self-pay | Admitting: Urology

## 2013-07-16 DIAGNOSIS — N390 Urinary tract infection, site not specified: Secondary | ICD-10-CM

## 2013-07-17 ENCOUNTER — Other Ambulatory Visit: Payer: Self-pay | Admitting: Urology

## 2013-07-18 ENCOUNTER — Ambulatory Visit (HOSPITAL_COMMUNITY)
Admission: RE | Admit: 2013-07-18 | Discharge: 2013-07-18 | Disposition: A | Discharge status: Home / Self Care | Payer: Medicare PPO | Source: Ambulatory Visit | Attending: Urology | Admitting: Urology

## 2013-07-18 ENCOUNTER — Encounter (HOSPITAL_COMMUNITY): Payer: Self-pay

## 2013-07-18 ENCOUNTER — Other Ambulatory Visit (HOSPITAL_COMMUNITY): Payer: Self-pay | Admitting: Urology

## 2013-07-18 DIAGNOSIS — I998 Other disorder of circulatory system: Secondary | ICD-10-CM | POA: Insufficient documentation

## 2013-07-18 DIAGNOSIS — G20A1 Parkinson's disease without dyskinesia, without mention of fluctuations: Secondary | ICD-10-CM | POA: Insufficient documentation

## 2013-07-18 DIAGNOSIS — N390 Urinary tract infection, site not specified: Secondary | ICD-10-CM

## 2013-07-18 DIAGNOSIS — G20 Parkinson's disease: Secondary | ICD-10-CM | POA: Insufficient documentation

## 2013-07-18 LAB — GLUCOSE, CAPILLARY: GLUCOSE-CAPILLARY: 105 mg/dL — AB (ref 70–99)

## 2013-07-18 MED ORDER — HEPARIN SOD (PORK) LOCK FLUSH 100 UNIT/ML IV SOLN
INTRAVENOUS | Status: AC
  Filled 2013-07-18: qty 5

## 2013-07-18 MED ORDER — SODIUM CHLORIDE 0.9 % IJ SOLN
10.0000 mL | Freq: Once | INTRAMUSCULAR | Status: AC
  Administered 2013-07-18: 10 mL

## 2013-07-18 MED ORDER — SODIUM CHLORIDE 0.9 % IV SOLN: Freq: Once | INTRAVENOUS | Status: DC

## 2013-07-18 MED ORDER — PIPERACILLIN-TAZOBACTAM 3.375 G IVPB 30 MIN
3.3750 g | Freq: Once | INTRAVENOUS | Status: AC
  Administered 2013-07-18: 3.375 g via INTRAVENOUS
  Filled 2013-07-18: qty 50

## 2013-07-18 MED ORDER — HEPARIN SOD (PORK) LOCK FLUSH 100 UNIT/ML IV SOLN
250.0000 [IU] | INTRAVENOUS | Status: AC | PRN
  Administered 2013-07-18: 12:00:00
  Filled 2013-07-18: qty 5

## 2013-07-18 MED ORDER — LIDOCAINE HCL 1 % IJ SOLN
INTRAMUSCULAR | Status: AC
  Filled 2013-07-18: qty 20

## 2013-07-18 NOTE — Progress Notes (Signed)
Pt. Tolerated Zosyn, pt. Denies any problems, VSS, daughter at side. Home Health to assist patient at home with infusion of Zosyn.

## 2013-07-18 NOTE — Progress Notes (Signed)
Dr. Ellin GoodieGrapey's nurse Gearldine BienenstockBrandy notified of Bp and pt's BP on arrival to unit, Zosyn stopped, and of pt. Becoming more alert and Bp slowly increasing.

## 2013-07-18 NOTE — Discharge Instructions (Signed)
Piperacillin; Tazobactam injection What is this medicine? PIPERACILLIN; TAZOBACTAM (pi PER a sil in; ta zoe BAK tam) is a penicillin antibiotic. It is used to treat certain kinds of bacterial infections. It will not work for colds, flu, or other viral infections. This medicine may be used for other purposes; ask your health care provider or pharmacist if you have questions. COMMON BRAND NAME(S): Zosyn What should I tell my health care provider before I take this medicine? They need to know if you have any of these conditions: -bleeding problems -kidney disease -salt restricted diet -an unusual or allergic reaction to piperacillin, tazobactam, other penicillin or cephalosporin antibiotics, imipenem, foods, dyes, or preservatives -pregnant or trying to get pregnant -breast-feeding How should I use this medicine? This medicine is for infusion into a vein. It is usually given by a health care professional in a hospital or clinic setting. If you get this medicine at home, you will be taught how to prepare and give this medicine. Use exactly as directed. Take your medicine at regular intervals. Do not take your medicine more often than directed. It is important that you put your used needles and syringes in a special sharps container. Do not put them in a trash can. If you do not have a sharps container, call your pharmacist or healthcare provider to get one. Talk to your pediatrician regarding the use of this medicine in children. Special care may be needed. Overdosage: If you think you have taken too much of this medicine contact a poison control center or emergency room at once. NOTE: This medicine is only for you. Do not share this medicine with others. What if I miss a dose? If you miss a dose, take it as soon as you can. If it is almost time for your next dose, take only that dose. Do not take double or extra doses. What may interact with this medicine? -aspirin and aspirin-like  drugs -certain antibiotics given by injection -medicines that treat or prevent blood clots like warfarin, heparin, enoxaparin, and dalteparin -methotrexate -probenecid -vecuronium used for sleep during surgery This list may not describe all possible interactions. Give your health care provider a list of all the medicines, herbs, non-prescription drugs, or dietary supplements you use. Also tell them if you smoke, drink alcohol, or use illegal drugs. Some items may interact with your medicine. What should I watch for while using this medicine? Tell your doctor or healthcare professional if your symptoms do not start to get better or if they get worse. Your doctor will monitor your condition and blood work as needed. Do not treat diarrhea with over the counter products. Contact your doctor if you have diarrhea that lasts more than 2 days or if it is severe and watery. This medicine can interfere with some urine glucose tests. If you use such tests, talk with your health care professional. What side effects may I notice from receiving this medicine? Side effects that you should report to your doctor or health care professional as soon as possible: -agitation or anxiety -allergic reactions like skin rash, itching or hives, swelling of the face, lips, or tongue -chest pain -difficulty breathing, wheezing -dizziness -fast, irregular heartbeat -fever or chills -high blood pressure -high blood sugar -red spots on the skin -redness, blistering, peeling or loosening of the skin, including inside the mouth -seizures -trouble passing urine or change in the amount of urine -unusual bleeding or bruising -unusually weak or tired Side effects that usually do not require medical attention (  report to your doctor or health care professional if they continue or are bothersome): -diarrhea or constipation -headache -nausea, vomiting -pain, swelling, or irritation at site where injected -stomach  upset -trouble sleeping This list may not describe all possible side effects. Call your doctor for medical advice about side effects. You may report side effects to FDA at 1-800-FDA-1088. Where should I keep my medicine? Keep out of the reach of children. If you are using this medicine at home, you will be instructed on how to store this medicine. Throw away any unused medicine after the expiration date on the label. NOTE: This sheet is a summary. It may not cover all possible information. If you have questions about this medicine, talk to your doctor, pharmacist, or health care provider.  2014, [employer]vier/Gold Standard. (2007-12-05 13:03:14)

## 2013-07-18 NOTE — Progress Notes (Signed)
Order received from Dr. Isabel CapriceGrapey to restart Zosyn slowly and to call with any problems.

## 2013-07-18 NOTE — Procedures (Signed)
Successful placement of right brachial approach 46 cm single lumen PICC line with tip at the superior caval-atrial junction.  The PICC line is ready for immediate use.

## 2013-07-18 NOTE — Progress Notes (Signed)
Pt's daughter called RN in room, stated pt. "sob", not alert, Bp 59/31, Zosyn stopped, ns started, pt. Moved to recliner, and lied back in recliner, pt. Slowly becoming more alert & BP increasing, Dr. Isabel CapriceGrapey paged.

## 2013-08-26 ENCOUNTER — Ambulatory Visit: Payer: Medicare PPO | Admitting: Neurology

## 2013-10-20 ENCOUNTER — Telehealth: Payer: Self-pay | Admitting: Neurology

## 2013-10-20 NOTE — Telephone Encounter (Signed)
I have reviewed his chart, he has appointment in January 01 2014, please rule out his appointment with Dr. Frances Furbish in her next available.

## 2013-10-20 NOTE — Telephone Encounter (Signed)
Patient's daughter calling--patient is in an assisted living and is having trouble with his gait--he is falling frequently--needs soon appointment--please call daughter who can be reached today except from 11:45am until 2pm when she will be in a meeting--please call--thank you.

## 2013-10-21 NOTE — Telephone Encounter (Signed)
I called patient, talked with the daughter. The patient is on the Sinemet 25/250 mg tablets 5 times daily. He is having episodes where his feet will freeze up, "glued to the floor". His walker will get ahead of him, and he will fall. They have taken the wheels off of the walker, but his falls continued. It is not clear that the freezing episodes are end dose related. I am not sure that splitting the Sinemet tablets in half is likely to help. The patient could potentially go on Rytary. The patient will be seen in office next week, the NP will be seeing the patient, but Dr. Frances Furbish can be brought in to help decide what medication dosing readjustment should be made.

## 2013-10-21 NOTE — Telephone Encounter (Signed)
Called and spoke with daughter(Ms Goree),scheduled/ confirmed sooner appt, ok'd per Dr Orlene Erm physician).  Daughter is wanting to know if the Sinemet can be cut in half and given more frequently dosages

## 2013-10-21 NOTE — Telephone Encounter (Signed)
Patients daughter called again.  Very anxious to hear from [place].  Concerned because he is having lots of falls.  Feels he needs to be seen this week or early next.  Also wants to know if the Sinemet can be cut in half and given more frequently?  Please call today.

## 2013-10-28 ENCOUNTER — Encounter: Payer: Self-pay | Admitting: Nurse Practitioner

## 2013-10-28 ENCOUNTER — Ambulatory Visit (INDEPENDENT_AMBULATORY_CARE_PROVIDER_SITE_OTHER): Payer: Medicare HMO | Admitting: Nurse Practitioner

## 2013-10-28 ENCOUNTER — Encounter (INDEPENDENT_AMBULATORY_CARE_PROVIDER_SITE_OTHER): Payer: Self-pay

## 2013-10-28 VITALS — BP 95/57 | HR 63 | Ht 64.0 in | Wt 134.0 lb

## 2013-10-28 DIAGNOSIS — G20A1 Parkinson's disease without dyskinesia, without mention of fluctuations: Secondary | ICD-10-CM

## 2013-10-28 DIAGNOSIS — Z9181 History of falling: Secondary | ICD-10-CM

## 2013-10-28 DIAGNOSIS — R296 Repeated falls: Secondary | ICD-10-CM | POA: Insufficient documentation

## 2013-10-28 DIAGNOSIS — G20 Parkinson's disease: Secondary | ICD-10-CM

## 2013-10-28 MED ORDER — CARBIDOPA-LEVODOPA 25-250 MG PO TABS: ORAL_TABLET | ORAL | Status: DC

## 2013-10-28 NOTE — Progress Notes (Addendum)
PATIENT: Alexander Holder DOB: 04-03-1922  REASON FOR VISIT: sooner follow up for Parkinson's Disease; falling more HISTORY FROM: patient  HISTORY OF PRESENT ILLNESS: UPDATE 10/28/13 (LL):  Since last visit, patient was asked to come in for 3 month follow up with Dr. Frances Furbish but there were no appointments available until 9 months out.  His regularly scheduled appointment is in August.  The nursing home staff at [facility] brought to the attention of patient's daughters that he is freezing more when walking and has fallen 3 times in the last 2 weeks without injury.  Patient tried U Step walker but had trouble with the controls, so he went back to his standard front-rolling walker.  He is unable to walk with a walker that does not have wheels.  Patient and daughter Boneta Lucks come in today for suggestions on what if anything can help.  He is currently on Sinemet 25-250 mg strength one pill 5 times a day for a total of 1250 mg of levodopa. Admin times were fixed, and not necessarily given in accordance to meals.  Daughter states that her father wants to stay as mobile as possible "and not go down without a fight" as he has always been athletic.  He states the rolling walker "gets away from him" and he falls.  He has no other complaint.  He does endorse visual phenomena at night sometimes that he knows are not real.  He does not have a cardiologist here, and has not seen one in many years.  His daughter states he has not had any heart related issues since his heart attack in his 64's.  He states his blood pressure is always on the lower end, he does not feel faint upon standing usually.  He has lost 8 lb. Since last visit in November.  04/08/13  Prior HPI (SA):  I saw your patient, Alexander Holder, upon your kind request in my neurologic clinic today for initial consultation of his parkinsonism. The patient is accompanied by his daughter, [name], today. As you know, Alexander Holder is a very pleasant 80 year old  right-handed gentleman with an underlying medical history of bradycardia, dementia, glaucoma, hyperlipidemia, who has been followed by neurologist in [place] for Parkinson's disease. He has been on Sinemet. He has been following him for his chronic conditions including dementia without behavioral disturbance, bradycardia, orthostatic hypotension in the context of Parkinson's disease, edema. He is at fall risk and has fallen in the past. His prior neurologist left. He is currently on Sinemet 25-250 mg strength one pill 5 times a day for a total of 1250 mg of levodopa.  I also reviewed his prior neurological records from [facility]. He used to see [name]. I reviewed her office visits from 04/15/2012, 06/11/2012, 09/30/2012, and 01/15/2013. His latest blood pressure was 97/49 at that visit. He was increased from qid to five times a day. He stays at [facility] since March 2014 and he moved from [place], [place]. His symptoms date back to 2000 and started with tremor and gait disturbance. He has always been Sinemet and was on Seroquel in the past for RBD, but stopped it in February 2014. He has not been on any medication for his OH. He has had VH in the past. He has had some memory but never was on dementia medication. He is now taking C/L 25/250 mg 1 pill 5 x / day: 7, 10, 1 PM, 4 PM and 8 PM. He reports no  SEs and feels it is still working well for him. He gets his medications administered by the staff at [facility]. His wife has dementia and stays at [facility].  Both daughters ([place]/Jenny and Elizabeth/Betsy) have POA. He fell today and sustained carpet burns on his knees, but did hit his head. No loss of consciousness. He denies HA, CP or SOB, no lethargy and daughter reports no change in personality, no lethargy.  He has been using a 2 wheeled walker since last year and was tried on a U step walker, but could not use the  reverse brakes.   REVIEW OF SYSTEMS: Full 14 system review of systems performed and notable only for: walking difficulty.  ALLERGIES: No Known Allergies  HOME MEDICATIONS: Outpatient Prescriptions Prior to Visit  Medication Sig Dispense Refill  . aspirin 81 MG tablet Take 81 mg by mouth daily.      . bimatoprost (LUMIGAN) 0.01 % SOLN Place 1 drop into the right eye at bedtime.      . donepezil (ARICEPT) 5 MG tablet Take 5 mg by mouth at bedtime.      . dorzolamide-timolol (COSOPT) 22.3-6.8 MG/ML ophthalmic solution Place 1 drop into both eyes every 12 (twelve) hours.       . Ergocalciferol (VITAMIN D2) 2000 UNITS TABS Take 2,000 Units by mouth daily.      . folic acid (FOLVITE) 800 MCG tablet Take 800 mcg by mouth daily.      . furosemide (LASIX) 20 MG tablet Take 20 mg by mouth daily as needed for fluid.      Marland Kitchen ibuprofen (ADVIL,MOTRIN) 200 MG tablet Take 200 mg by mouth every 6 (six) hours as needed for mild pain.      . Multiple Vitamins-Minerals (MULTIVITAMIN PO) Take 1 tablet by mouth daily.      . niacin 500 MG tablet Take 500 mg by mouth daily.      . Omega-3 Fatty Acids (FISH OIL) 1200 MG CAPS Take 1,200 mg by mouth daily.      . carbidopa-levodopa (SINEMET IR) 25-250 MG per tablet Take 1 tablet by mouth every 4 (four) hours. Pt takes at 7 am, 10 am, 2 pm, 4:30 pm, and 8 pm      . cephALEXin (KEFLEX) 500 MG capsule Take 1 capsule (500 mg total) by mouth 2 (two) times daily. X 7 days  14 capsule  0   No facility-administered medications prior to visit.     PHYSICAL EXAM  Filed Vitals:   10/28/13 0926  BP: 95/57  Pulse: 63  Height: 5\' 4"  (1.626 m)  Weight: 134 lb (60.782 kg)   Body mass index is 22.99 kg/(m^2). No exam data present   Generalized: Well developed, in no acute distress, He looks frail and mildly deconditioned. He has mild temporal wasting.   Head: normocephalic and atraumatic. Extraocular tracking shows moderate saccadic breakdown without nystagmus noted.  There is limitation to upper gaze. There is moderate decrease in eye blink rate. Hearing is not impaired. Face is symmetric with moderate facial masking and normal facial sensation. There is no lip, neck or jaw tremor. Neck is moderately to severely rigid with decreased passive ROM. There are no carotid bruits on auscultation. Oropharynx exam reveals mild mouth dryness. No significant airway crowding is noted. Mallampati is class I. Tongue protrudes centrally and palate elevates symmetrically.  There is no drooling.  Neck: Supple, no carotid bruits  Cardiac: Regular rate rhythm, no murmur  Musculoskeletal: No deformity  Neurological examination  Mentation: The patient is awake and alert, paying good attention. He is able to partially provide the history. His daughter provides some of the history. He is oriented to: person, situation, month of year and year. His memory, attention, language and knowledge are impaired.  There is a moderate degree of bradyphrenia. Speech is moderately hypophonic with mild dysarthria noted. Mood is congruent and affect is normal.  Cranial nerve II-XII:  Pupils were equal round reactive to light extraocular movements were full, visual field were full on confrontational test. Facial sensation and strength were normal. hearing was intact to finger rubbing bilaterally. Uvula tongue midline. head turning and shoulder shrug and were normal and symmetric.Tongue protrusion into cheek strength was normal. Motor: Normal bulk, and strength for age is noted. There are no dyskinesias noted.  Tone is moderately rigid with presence of cogwheeling in the bilateral upper extremities. There is overall moderate bradykinesia. There is no drift or rebound.  There is no tremor.  Sensory: Sensory testing is intact to soft touch on all 4 extremities. No evidence of extinction is noted.  Coordination: Cerebellar testing reveals good finger-nose-finger and heel-to-shin bilaterally.  Gait and  station: He stands up from the seated position with moderate difficulty and needs to push up with His hands. He fell back in his chair once.  He needs assistance getting up. No veering to one side is noted. He is not noted to lean to the side. Posture is moderately stooped. Stance is wide-based. He walks with decrease in stride length and pace and maneuvers his 2 wheeled walker well. He does have trouble turning and has mild hesitation while turning.  He has mild to moderately impaired balance.  Reflexes: 1+ in the upper extremities and 1+ in the left knee, otherwise absent in the lower extremities.   ASSESSMENT AND PLAN 80 y.o. year old male  has a past medical history of Paralysis agitans; Unspecified glaucoma; Orthostatic hypotension; Other dysphagia; Other specified cardiac dysrhythmias(427.89); Other and unspecified hyperlipidemia; Edema; Abnormality of gait; Seborrheic dermatitis, unspecified; Impacted cerumen; Mild cognitive impairment, so stated; Other family disruption; and Parkinson disease here with an increase in walking difficulty; patient is falling more often.  Unfortunately, patient is frail and has advanced Parkinson's, there are not many other suitable medication options for his symptoms.  I consulted Dr. Frances Furbish in deciding plan of care.  We decided to reduce the patient's dose of Sinemet from 5 times per day to 4, as the dose may be too high for him now, with written order instructions to administer 30 minutes before each meal and at 8 pm.  Also PT eval and treat for gait safety/frequent falls.  Asked daughter to speak to PCP about evaluation of continuing Aricept as he may be experiencing bradycardia or orthostatic hypotension and Aricept may be contributing to this.  Daughter feels that they saw a big help in father's cognition with Aricept.  Asked daughter to keep his already scheduled appointment in August with Dr. Frances Furbish and contact our office with any questions or concerns.  She was in  agreement.  Orders Placed This Encounter  Procedures  . Ambulatory referral to Physical Therapy   Meds ordered this encounter  Medications  . carbidopa-levodopa (SINEMET IR) 25-250 MG per tablet    Sig: Medication must be given 30 minutes before meals and 8 pm.    Dispense:  120 tablet    Refill:  5    Order Specific Question:  Supervising Provider  Answer:  Huston FoleyHAR, SAIMA [[id number]]   Ronal FearLYNN E. Aislin Onofre, MSN, NP-C 10/28/2013, 10:31 AM [facility] [address], [address] [place], [place] [address] [phone]  Note: This document was prepared with digital dictation and possible smart phrase technology. Any transcriptional errors that result from this process are unintentional.  I reviewed the above note and documentation by the Nurse Practitioner and agree with the history, physical exam, assessment and plan as outlined above.

## 2013-10-28 NOTE — Patient Instructions (Addendum)
I encouraged the patient to eat healthy, exercise daily and keep well hydrated, to keep a scheduled bedtime and wake time routine, to not skip any meals and eat healthy snacks in between meals and to have protein with every meal. In particular, I stressed the importance of regular exercise, within of course the patient's own mobility limitations.   As far as further diagnostic testing is concerned, I suggested: no change.   As far as medications are concerned, I recommended the following at this time: increase the frequency of Sinemet 25/250 to:  Reducing Sinemet dose frequency to 4 times a day, specifically 30 minutes before meals and at 8pm.  Also ordering Physical Therapy for gait and safety training, due to frequent falls.   Dr. Frances Furbish would like to see the patient back at his regularly scheduled appointment in August, sooner if the need arises and encouraged them to call with any interim questions, concerns, problems or updates and refill requests.  Thank you very much for allowing me to participate in the care of this nice patient. If I can be of any further assistance to you please do not hesitate to call me at [phone].

## 2013-11-24 ENCOUNTER — Emergency Department (HOSPITAL_COMMUNITY): Payer: Medicare HMO

## 2013-11-24 ENCOUNTER — Emergency Department (HOSPITAL_COMMUNITY)
Admission: EM | Admit: 2013-11-24 | Discharge: 2013-11-24 | Disposition: A | Discharge status: Home / Self Care | Payer: Medicare HMO | Attending: Emergency Medicine | Admitting: Emergency Medicine

## 2013-11-24 ENCOUNTER — Encounter (HOSPITAL_COMMUNITY): Payer: Self-pay | Admitting: Emergency Medicine

## 2013-11-24 DIAGNOSIS — F29 Unspecified psychosis not due to a substance or known physiological condition: Secondary | ICD-10-CM | POA: Insufficient documentation

## 2013-11-24 DIAGNOSIS — R4182 Altered mental status, unspecified: Secondary | ICD-10-CM | POA: Insufficient documentation

## 2013-11-24 DIAGNOSIS — H409 Unspecified glaucoma: Secondary | ICD-10-CM | POA: Insufficient documentation

## 2013-11-24 DIAGNOSIS — G20 Parkinson's disease: Secondary | ICD-10-CM | POA: Insufficient documentation

## 2013-11-24 DIAGNOSIS — J40 Bronchitis, not specified as acute or chronic: Secondary | ICD-10-CM

## 2013-11-24 DIAGNOSIS — Z8639 Personal history of other endocrine, nutritional and metabolic disease: Secondary | ICD-10-CM | POA: Insufficient documentation

## 2013-11-24 DIAGNOSIS — Z872 Personal history of diseases of the skin and subcutaneous tissue: Secondary | ICD-10-CM | POA: Insufficient documentation

## 2013-11-24 DIAGNOSIS — N390 Urinary tract infection, site not specified: Secondary | ICD-10-CM | POA: Insufficient documentation

## 2013-11-24 DIAGNOSIS — Z8679 Personal history of other diseases of the circulatory system: Secondary | ICD-10-CM | POA: Insufficient documentation

## 2013-11-24 DIAGNOSIS — Z862 Personal history of diseases of the blood and blood-forming organs and certain disorders involving the immune mechanism: Secondary | ICD-10-CM | POA: Insufficient documentation

## 2013-11-24 DIAGNOSIS — J209 Acute bronchitis, unspecified: Secondary | ICD-10-CM | POA: Insufficient documentation

## 2013-11-24 DIAGNOSIS — G20A1 Parkinson's disease without dyskinesia, without mention of fluctuations: Secondary | ICD-10-CM | POA: Insufficient documentation

## 2013-11-24 DIAGNOSIS — Z7982 Long term (current) use of aspirin: Secondary | ICD-10-CM | POA: Insufficient documentation

## 2013-11-24 LAB — BASIC METABOLIC PANEL
Anion gap: 11 (ref 5–15)
BUN: 20 mg/dL (ref 6–23)
CHLORIDE: 101 meq/L (ref 96–112)
CO2: 26 mEq/L (ref 19–32)
CREATININE: 0.87 mg/dL (ref 0.50–1.35)
Calcium: 9.2 mg/dL (ref 8.4–10.5)
GFR calc Af Amer: 86 mL/min — ABNORMAL LOW (ref >90)
GFR calc non Af Amer: 74 mL/min — ABNORMAL LOW (ref >90)
Glucose, Bld: 81 mg/dL (ref 70–99)
Potassium: 3.9 mEq/L (ref 3.7–5.3)
Sodium: 138 mEq/L (ref 137–147)

## 2013-11-24 LAB — URINE MICROSCOPIC-ADD ON

## 2013-11-24 LAB — CBC WITH DIFFERENTIAL/PLATELET
Basophils Absolute: 0 10*3/uL (ref 0.0–0.1)
Basophils Relative: 0 % (ref 0–1)
EOS ABS: 0.4 10*3/uL (ref 0.0–0.7)
Eosinophils Relative: 6 % — ABNORMAL HIGH (ref 0–5)
HEMATOCRIT: 35.7 % — AB (ref 39.0–52.0)
HEMOGLOBIN: 12.1 g/dL — AB (ref 13.0–17.0)
Lymphocytes Relative: 22 % (ref 12–46)
Lymphs Abs: 1.5 10*3/uL (ref 0.7–4.0)
MCH: 32 pg (ref 26.0–34.0)
MCHC: 33.9 g/dL (ref 30.0–36.0)
MCV: 94.4 fL (ref 78.0–100.0)
MONO ABS: 0.6 10*3/uL (ref 0.1–1.0)
MONOS PCT: 9 % (ref 3–12)
NEUTROS ABS: 4.4 10*3/uL (ref 1.7–7.7)
Neutrophils Relative %: 63 % (ref 43–77)
Platelets: 253 10*3/uL (ref 150–400)
RBC: 3.78 MIL/uL — AB (ref 4.22–5.81)
RDW: 14 % (ref 11.5–15.5)
WBC: 7 10*3/uL (ref 4.0–10.5)

## 2013-11-24 LAB — URINALYSIS, ROUTINE W REFLEX MICROSCOPIC
Bilirubin Urine: NEGATIVE
GLUCOSE, UA: NEGATIVE mg/dL
Ketones, ur: NEGATIVE mg/dL
Nitrite: POSITIVE — AB
Protein, ur: NEGATIVE mg/dL
SPECIFIC GRAVITY, URINE: 1.012 (ref 1.005–1.030)
Urobilinogen, UA: 0.2 mg/dL (ref 0.0–1.0)
pH: 7 (ref 5.0–8.0)

## 2013-11-24 MED ORDER — BENZONATATE 100 MG PO CAPS: 100.0000 mg | ORAL_CAPSULE | Freq: Three times a day (TID) | ORAL | Status: DC

## 2013-11-24 MED ORDER — CEPHALEXIN 500 MG PO CAPS: 500.0000 mg | ORAL_CAPSULE | Freq: Two times a day (BID) | ORAL | Status: DC

## 2013-11-24 MED ORDER — PREDNISONE 50 MG PO TABS: 50.0000 mg | ORAL_TABLET | Freq: Every day | ORAL | Status: DC

## 2013-11-24 MED ORDER — AZITHROMYCIN 250 MG PO TABS: 250.0000 mg | ORAL_TABLET | Freq: Every day | ORAL | Status: DC

## 2013-11-24 MED ORDER — CEPHALEXIN 250 MG PO CAPS
500.0000 mg | ORAL_CAPSULE | Freq: Once | ORAL | Status: AC
  Administered 2013-11-24: 500 mg via ORAL
  Filled 2013-11-24: qty 2

## 2013-11-24 MED ORDER — LEVOFLOXACIN 750 MG PO TABS
750.0000 mg | ORAL_TABLET | Freq: Every day | ORAL | Status: DC
  Filled 2013-11-24: qty 1

## 2013-11-24 NOTE — ED Provider Notes (Signed)
CSN: [id number]     Arrival date & time 11/24/13  1448 History   First MD Initiated Contact with Patient 11/24/13 [phone]     Chief Complaint  Patient presents with  . Altered Mental Status     (Consider location/radiation/quality/duration/timing/severity/associated sxs/prior Treatment) HPI Comments: Pt comes in with cc of confusion. Pt is relatively healthy, but lives at an independent senior center due to parkinson's. Pt was noted to be somnolent by the caretaker, and not responsive, so she called EMS. In the ER, patient is 10x3. Daughter at bedside, states that patient was a little slow moving around y'day when she met him y'day. Pt denies nausea, emesis, fevers, chills, chest pains, shortness of breath, headaches, abdominal pain. He might be having some pain with urination.   Patient is a 80 y.o. male presenting with altered mental status. The history is provided by the patient.  Altered Mental Status Presenting symptoms: confusion   Associated symptoms: fever   Associated symptoms: no headaches and no light-headedness     Past Medical History  Diagnosis Date  . Paralysis agitans   . Unspecified glaucoma   . Orthostatic hypotension   . Other dysphagia   . Other specified cardiac dysrhythmias(427.89)   . Other and unspecified hyperlipidemia   . Edema   . Abnormality of gait   . Seborrheic dermatitis, unspecified   . Impacted cerumen   . Mild cognitive impairment, so stated   . Other family disruption     Wife in ALF with dementia  . Parkinson disease    History reviewed. No pertinent past surgical history. Family History  Problem Relation Age of Onset  . Cancer Brother   . Diabetes Mother    History  Substance Use Topics  . Smoking status: Never Smoker   . Smokeless tobacco: Not on file  . Alcohol Use: No    Review of Systems  Constitutional: Positive for fever. Negative for chills and activity change.  Eyes: Negative for visual disturbance.  Respiratory: Positive  for cough. Negative for chest tightness, shortness of breath and wheezing.   Cardiovascular: Negative for chest pain.  Gastrointestinal: Negative for abdominal distention.  Genitourinary: Negative for dysuria, enuresis and difficulty urinating.  Musculoskeletal: Negative for arthralgias and neck pain.  Neurological: Negative for dizziness, light-headedness and headaches.  Psychiatric/Behavioral: Positive for confusion.      Allergies  Review of patient's allergies indicates no known allergies.  Home Medications   Prior to Admission medications   Medication Sig Start Date End Date Taking? Authorizing Provider  aspirin 81 MG tablet Take 81 mg by mouth daily.   Yes Historical Provider, MD  bimatoprost (LUMIGAN) 0.01 % SOLN Place 1 drop into the right eye at bedtime.   Yes Historical Provider, MD  carbidopa-levodopa (SINEMET IR) 25-250 MG per tablet Medication must be given 30 minutes before meals and 8 pm. 10/28/13  Yes Philmore Pali, NP  donepezil (ARICEPT) 5 MG tablet Take 5 mg by mouth at bedtime.   Yes Historical Provider, MD  dorzolamide-timolol (COSOPT) 22.3-6.8 MG/ML ophthalmic solution Place 1 drop into both eyes every 12 (twelve) hours.  02/05/13  Yes Historical Provider, MD  Ergocalciferol (VITAMIN D2) 2000 UNITS TABS Take 2,000 Units by mouth daily.   Yes Historical Provider, MD  folic acid (FOLVITE) 314 MCG tablet Take 800 mcg by mouth daily.   Yes Historical Provider, MD  furosemide (LASIX) 20 MG tablet Take 20 mg by mouth daily as needed for fluid.   Yes Historical Provider,  MD  guaiFENesin-dextromethorphan (ROBITUSSIN DM) 100-10 MG/5ML syrup Take 10 mLs by mouth every 6 (six) hours as needed for cough.   Yes Historical Provider, MD  ibuprofen (ADVIL,MOTRIN) 200 MG tablet Take 200 mg by mouth every 6 (six) hours as needed for mild pain.   Yes Historical Provider, MD  Multiple Vitamins-Minerals (MULTIVITAMIN PO) Take 1 tablet by mouth daily.   Yes Historical Provider, MD  niacin 500  MG tablet Take 500 mg by mouth daily.   Yes Historical Provider, MD  Omega-3 Fatty Acids (FISH OIL) 1200 MG CAPS Take 1,200 mg by mouth daily.   Yes Historical Provider, MD  azithromycin (ZITHROMAX) 250 MG tablet Take 1 tablet (250 mg total) by mouth daily. Take first 2 tablets together, then 1 every day until finished. 11/24/13   Varney Biles, MD  benzonatate (TESSALON) 100 MG capsule Take 1 capsule (100 mg total) by mouth every 8 (eight) hours. 11/24/13   Varney Biles, MD  cephALEXin (KEFLEX) 500 MG capsule Take 1 capsule (500 mg total) by mouth 2 (two) times daily. 11/24/13   Varney Biles, MD  predniSONE (DELTASONE) 50 MG tablet Take 1 tablet (50 mg total) by mouth daily. 11/24/13   Aracely Rickett, MD   BP 159/65  Pulse 55  Temp(Src) 98.2 F (36.8 C) (Rectal)  Resp 15  SpO2 96% Physical Exam  Nursing note and vitals reviewed. Constitutional: He is oriented to person, place, and time. He appears well-developed.  HENT:  Head: Normocephalic and atraumatic.  Eyes: Conjunctivae and EOM are normal. Pupils are equal, round, and reactive to light.  Neck: Normal range of motion. Neck supple.  Cardiovascular: Normal rate and regular rhythm.   Pulmonary/Chest: Effort normal. He has no wheezes. He has rales.  Abdominal: Soft. Bowel sounds are normal. He exhibits no distension. There is no tenderness. There is no rebound and no guarding.  Neurological: He is alert and oriented to person, place, and time.  Skin: Skin is warm.    ED Course  Procedures (including critical care time) Labs Review Labs Reviewed  CBC WITH DIFFERENTIAL - Abnormal; Notable for the following:    RBC 3.78 (*)    Hemoglobin 12.1 (*)    HCT 35.7 (*)    Eosinophils Relative 6 (*)    All other components within normal limits  BASIC METABOLIC PANEL - Abnormal; Notable for the following:    GFR calc non Af Amer 74 (*)    GFR calc Af Amer 86 (*)    All other components within normal limits  URINALYSIS, ROUTINE W REFLEX  MICROSCOPIC - Abnormal; Notable for the following:    Hgb urine dipstick TRACE (*)    Nitrite POSITIVE (*)    Leukocytes, UA LARGE (*)    All other components within normal limits  URINE CULTURE  URINE MICROSCOPIC-ADD ON    Imaging Review Dg Chest 2 View  11/24/2013   CLINICAL DATA:  Cough.  EXAM: CHEST  2 VIEW  COMPARISON:  None.  FINDINGS: Mild cardiac enlargement. No pulmonary vascular congestion or edema. Focal area of atelectasis or infiltration demonstrated in the right lung base. No blunting of costophrenic angles. No pneumothorax.  IMPRESSION: Infiltration or atelectasis in the right lung base. Mild cardiac enlargement.   Electronically Signed   By: Lucienne Capers M.D.   On: 11/24/2013 05:09     EKG Interpretation None      MDM   Final diagnoses:  UTI (lower urinary tract infection)  Bronchitis    Pt  comes in with cc of AMS. ROS is positive for dysuria, cough x 3 weeks. UA is showing infection. Keflex given here, and will be discharged with that. No pyelo suspected, and 0 SIRs. For the cough, we will treat as bronchitis.  Pt is stable for discharge. Daughter at bedside, and in agreement with the plan. Reports that patient is reassessed every 2 hours at the nursing site, which is reassuring.  Varney Biles, MD 11/24/13 [phone]

## 2013-11-24 NOTE — ED Notes (Signed)
[facility]

## 2013-11-24 NOTE — ED Notes (Signed)
Pt tolerating walking with 2x assistance by this RN and another Charity fundraiserN.  Pt was mildly shaky at the beginning and stance became more stable.  Was able to maintain a slow, steady pace with assistance.

## 2013-11-24 NOTE — Discharge Instructions (Signed)
Based on the results here, you definitely have a urinary infection, and possible bronchitis. Please take the antibiotics prescribed. See your doctor in 1 week.  Please return to the ER if your symptoms worsen; you have increased pain, fevers, chills, inability to keep any medications down, confusion. Otherwise see the outpatient doctor as requested.   Bronchitis Bronchitis is inflammation of the airways that extend from the windpipe into the lungs (bronchi). The inflammation often causes mucus to develop, which leads to a cough. If the inflammation becomes severe, it may cause shortness of breath. CAUSES  Bronchitis may be caused by:   Viral infections.   Bacteria.   Cigarette smoke.   Allergens, pollutants, and other irritants.  SIGNS AND SYMPTOMS  The most common symptom of bronchitis is a frequent cough that produces mucus. Other symptoms include:  Fever.   Body aches.   Chest congestion.   Chills.   Shortness of breath.   Sore throat.  DIAGNOSIS  Bronchitis is usually diagnosed through a medical history and physical exam. Tests, such as chest X-rays, are sometimes done to rule out other conditions.  TREATMENT  You may need to avoid contact with whatever caused the problem (smoking, for example). Medicines are sometimes needed. These may include:  Antibiotics. These may be prescribed if the condition is caused by bacteria.  Cough suppressants. These may be prescribed for relief of cough symptoms.   Inhaled medicines. These may be prescribed to help open your airways and make it easier for you to breathe.   Steroid medicines. These may be prescribed for those with recurrent (chronic) bronchitis. HOME CARE INSTRUCTIONS  Get plenty of rest.   Drink enough fluids to keep your urine clear or pale yellow (unless you have a medical condition that requires fluid restriction). Increasing fluids may help thin your secretions and will prevent dehydration.    Only take over-the-counter or prescription medicines as directed by your health care provider.  Only take antibiotics as directed. Make sure you finish them even if you start to feel better.  Avoid secondhand smoke, irritating chemicals, and strong fumes. These will make bronchitis worse. If you are a smoker, quit smoking. Consider using nicotine gum or skin patches to help control withdrawal symptoms. Quitting smoking will help your lungs heal faster.   Put a cool-mist humidifier in your bedroom at night to moisten the air. This may help loosen mucus. Change the water in the humidifier daily. You can also run the hot water in your shower and sit in the bathroom with the door closed for 5-10 minutes.   Follow up with your health care provider as directed.   Wash your hands frequently to avoid catching bronchitis again or spreading an infection to others.  SEEK MEDICAL CARE IF: Your symptoms do not improve after 1 week of treatment.  SEEK IMMEDIATE MEDICAL CARE IF:  Your fever increases.  You have chills.   You have chest pain.   You have worsening shortness of breath.   You have bloody sputum.  You faint.  You have lightheadedness.  You have a severe headache.   You vomit repeatedly. MAKE SURE YOU:   Understand these instructions.  Will watch your condition.  Will get help right away if you are not doing well or get worse. Document Released: 05/08/2005 Document Revised: 02/26/2013 Document Reviewed: 12/31/2012 [facility] Patient Information 2015 [place], [place]. This information is not intended to replace advice given to you by your health care provider. Make sure you discuss any  questions you have with your health care provider. Urinary Tract Infection Urinary tract infections (UTIs) can develop anywhere along your urinary tract. Your urinary tract is your body's drainage system for removing wastes and extra water. Your urinary tract includes two kidneys, two  ureters, a bladder, and a urethra. Your kidneys are a pair of bean-shaped organs. Each kidney is about the size of your fist. They are located below your ribs, one on each side of your spine. CAUSES Infections are caused by microbes, which are microscopic organisms, including fungi, viruses, and bacteria. These organisms are so small that they can only be seen through a microscope. Bacteria are the microbes that most commonly cause UTIs. SYMPTOMS  Symptoms of UTIs may vary by age and gender of the patient and by the location of the infection. Symptoms in young women typically include a frequent and intense urge to urinate and a painful, burning feeling in the bladder or urethra during urination. Older women and men are more likely to be tired, shaky, and weak and have muscle aches and abdominal pain. A fever may mean the infection is in your kidneys. Other symptoms of a kidney infection include pain in your back or sides below the ribs, nausea, and vomiting. DIAGNOSIS To diagnose a UTI, your caregiver will ask you about your symptoms. Your caregiver also will ask to provide a urine sample. The urine sample will be tested for bacteria and white blood cells. White blood cells are made by your body to help fight infection. TREATMENT  Typically, UTIs can be treated with medication. Because most UTIs are caused by a bacterial infection, they usually can be treated with the use of antibiotics. The choice of antibiotic and length of treatment depend on your symptoms and the type of bacteria causing your infection. HOME CARE INSTRUCTIONS  If you were prescribed antibiotics, take them exactly as your caregiver instructs you. Finish the medication even if you feel better after you have only taken some of the medication.  Drink enough water and fluids to keep your urine clear or pale yellow.  Avoid caffeine, tea, and carbonated beverages. They tend to irritate your bladder.  Empty your bladder often. Avoid  holding urine for long periods of time.  Empty your bladder before and after sexual intercourse.  After a bowel movement, women should cleanse from front to back. Use each tissue only once. SEEK MEDICAL CARE IF:   You have back pain.  You develop a fever.  Your symptoms do not begin to resolve within 3 days. SEEK IMMEDIATE MEDICAL CARE IF:   You have severe back pain or lower abdominal pain.  You develop chills.  You have nausea or vomiting.  You have continued burning or discomfort with urination. MAKE SURE YOU:   Understand these instructions.  Will watch your condition.  Will get help right away if you are not doing well or get worse. Document Released: 02/15/2005 Document Revised: 11/07/2011 Document Reviewed: 06/16/2011 [facility] Patient Information 2015 [place], [place]. This information is not intended to replace advice given to you by your health care provider. Make sure you discuss any questions you have with your health care provider.

## 2013-11-24 NOTE — ED Notes (Addendum)
Pt to ED via EMS for altered mental status, changes noticed this morning by caregiver. Caregiver reports to EMS that pt alerts and oriented to base line around 1600 11/23/2013 but noticed this morning that pt was lethargic and changes in urine color. Per EMS, BP-168/88, HR-58, SpO2-97% on room.

## 2013-11-27 LAB — URINE CULTURE

## 2013-11-28 ENCOUNTER — Telehealth (HOSPITAL_BASED_OUTPATIENT_CLINIC_OR_DEPARTMENT_OTHER): Payer: Self-pay | Admitting: Emergency Medicine

## 2013-11-28 NOTE — Telephone Encounter (Signed)
Post ED Visit - Positive Culture Follow-up: Successful Patient Follow-Up  Culture assessed and recommendations reviewed by: []  Wes Dulaney, Pharm.D., BCPS [x]  Celedonio MiyamotoJeremy Holder, Pharm.D., BCPS []  Georgina PillionElizabeth Martin, Pharm.D., BCPS []  CanaseragaMinh Pham, [address] Rainbow BoulevardPharm.D., BCPS, AAHIVP []  Estella HuskMichelle Turner, Pharm.D., BCPS, AAHIVP  Positive urine culture  [x]  Patient discharged without antimicrobial prescription and treatment is now indicated []  Organism is resistant to prescribed ED discharge antimicrobial []  Patient with positive blood cultures  Changes discussed with ED provider: Coral CeoJessica Palmer PA-C New treatment plan: Call patient and see if improved.    [place], Jenel LucksKylie Holder, 1:56 PM

## 2013-12-06 ENCOUNTER — Telehealth (HOSPITAL_BASED_OUTPATIENT_CLINIC_OR_DEPARTMENT_OTHER): Payer: Self-pay

## 2013-12-06 NOTE — Telephone Encounter (Signed)
Third shift caregiver unable to tell FM if pts cough and urinary pain/dysuria improved as pt is sleeping.  [facility] Mia Saulter [phone]

## 2014-01-01 ENCOUNTER — Ambulatory Visit: Payer: Medicare PPO | Admitting: Neurology

## 2014-01-28 ENCOUNTER — Emergency Department (HOSPITAL_COMMUNITY): Payer: Medicare HMO

## 2014-01-28 ENCOUNTER — Inpatient Hospital Stay (HOSPITAL_COMMUNITY)
Admission: EM | Admit: 2014-01-28 | Discharge: 2014-01-30 | DRG: 194 | Payer: Medicare HMO | Attending: Internal Medicine | Admitting: Internal Medicine

## 2014-01-28 ENCOUNTER — Encounter (HOSPITAL_COMMUNITY): Payer: Self-pay | Admitting: Emergency Medicine

## 2014-01-28 DIAGNOSIS — N289 Disorder of kidney and ureter, unspecified: Secondary | ICD-10-CM | POA: Diagnosis present

## 2014-01-28 DIAGNOSIS — N390 Urinary tract infection, site not specified: Secondary | ICD-10-CM | POA: Diagnosis present

## 2014-01-28 DIAGNOSIS — G20A1 Parkinson's disease without dyskinesia, without mention of fluctuations: Secondary | ICD-10-CM | POA: Diagnosis present

## 2014-01-28 DIAGNOSIS — E785 Hyperlipidemia, unspecified: Secondary | ICD-10-CM | POA: Diagnosis present

## 2014-01-28 DIAGNOSIS — R4182 Altered mental status, unspecified: Secondary | ICD-10-CM | POA: Diagnosis present

## 2014-01-28 DIAGNOSIS — R9431 Abnormal electrocardiogram [ECG] [EKG]: Secondary | ICD-10-CM | POA: Diagnosis present

## 2014-01-28 DIAGNOSIS — J189 Pneumonia, unspecified organism: Principal | ICD-10-CM

## 2014-01-28 DIAGNOSIS — G20C Parkinsonism, unspecified: Secondary | ICD-10-CM | POA: Diagnosis present

## 2014-01-28 DIAGNOSIS — I959 Hypotension, unspecified: Secondary | ICD-10-CM | POA: Diagnosis present

## 2014-01-28 DIAGNOSIS — R300 Dysuria: Secondary | ICD-10-CM | POA: Diagnosis present

## 2014-01-28 DIAGNOSIS — N2889 Other specified disorders of kidney and ureter: Secondary | ICD-10-CM | POA: Diagnosis present

## 2014-01-28 DIAGNOSIS — R82998 Other abnormal findings in urine: Secondary | ICD-10-CM | POA: Diagnosis present

## 2014-01-28 DIAGNOSIS — I498 Other specified cardiac arrhythmias: Secondary | ICD-10-CM | POA: Diagnosis present

## 2014-01-28 DIAGNOSIS — I440 Atrioventricular block, first degree: Secondary | ICD-10-CM | POA: Diagnosis present

## 2014-01-28 DIAGNOSIS — G20 Parkinson's disease: Secondary | ICD-10-CM | POA: Diagnosis present

## 2014-01-28 HISTORY — DX: Pneumonia, unspecified organism: J18.9

## 2014-01-28 HISTORY — DX: Anxiety disorder, unspecified: F41.9

## 2014-01-28 HISTORY — DX: Personal history of other medical treatment: Z92.89

## 2014-01-28 HISTORY — DX: Gastro-esophageal reflux disease without esophagitis: K21.9

## 2014-01-28 HISTORY — DX: Essential (primary) hypertension: I10

## 2014-01-28 HISTORY — DX: Depression, unspecified: F32.A

## 2014-01-28 HISTORY — DX: Unspecified osteoarthritis, unspecified site: M19.90

## 2014-01-28 HISTORY — DX: Major depressive disorder, single episode, unspecified: F32.9

## 2014-01-28 LAB — COMPREHENSIVE METABOLIC PANEL
ALK PHOS: 74 U/L (ref 39–117)
ALT: 8 U/L (ref 0–53)
ANION GAP: 11 (ref 5–15)
AST: 24 U/L (ref 0–37)
Albumin: 3.1 g/dL — ABNORMAL LOW (ref 3.5–5.2)
BILIRUBIN TOTAL: 0.3 mg/dL (ref 0.3–1.2)
BUN: 26 mg/dL — AB (ref 6–23)
CHLORIDE: 102 meq/L (ref 96–112)
CO2: 25 meq/L (ref 19–32)
Calcium: 9.3 mg/dL (ref 8.4–10.5)
Creatinine, Ser: 0.95 mg/dL (ref 0.50–1.35)
GFR calc Af Amer: 83 mL/min — ABNORMAL LOW (ref >90)
GFR calc non Af Amer: 72 mL/min — ABNORMAL LOW (ref >90)
GLUCOSE: 95 mg/dL (ref 70–99)
POTASSIUM: 4.3 meq/L (ref 3.7–5.3)
Sodium: 138 mEq/L (ref 137–147)
Total Protein: 6.7 g/dL (ref 6.0–8.3)

## 2014-01-28 LAB — URINE MICROSCOPIC-ADD ON

## 2014-01-28 LAB — URINALYSIS, ROUTINE W REFLEX MICROSCOPIC
BILIRUBIN URINE: NEGATIVE
Glucose, UA: NEGATIVE mg/dL
Ketones, ur: NEGATIVE mg/dL
Nitrite: NEGATIVE
Protein, ur: NEGATIVE mg/dL
SPECIFIC GRAVITY, URINE: 1.021 (ref 1.005–1.030)
Urobilinogen, UA: 0.2 mg/dL (ref 0.0–1.0)
pH: 6 (ref 5.0–8.0)

## 2014-01-28 LAB — CBC
HEMATOCRIT: 38.6 % — AB (ref 39.0–52.0)
HEMOGLOBIN: 13.1 g/dL (ref 13.0–17.0)
MCH: 31.4 pg (ref 26.0–34.0)
MCHC: 33.9 g/dL (ref 30.0–36.0)
MCV: 92.6 fL (ref 78.0–100.0)
Platelets: 211 10*3/uL (ref 150–400)
RBC: 4.17 MIL/uL — AB (ref 4.22–5.81)
RDW: 14.2 % (ref 11.5–15.5)
WBC: 6.6 10*3/uL (ref 4.0–10.5)

## 2014-01-28 LAB — GLUCOSE, CAPILLARY: Glucose-Capillary: 126 mg/dL — ABNORMAL HIGH (ref 70–99)

## 2014-01-28 LAB — TROPONIN I: Troponin I: <0.3 ng/mL (ref <0.30)

## 2014-01-28 LAB — STREP PNEUMONIAE URINARY ANTIGEN: Strep Pneumo Urinary Antigen: NEGATIVE

## 2014-01-28 LAB — I-STAT TROPONIN, ED: Troponin i, poc: 0.01 ng/mL (ref 0.00–0.08)

## 2014-01-28 LAB — PROTIME-INR
INR: 1.14 (ref 0.00–1.49)
Prothrombin Time: 14.6 seconds (ref 11.6–15.2)

## 2014-01-28 LAB — MAGNESIUM: MAGNESIUM: 1.9 mg/dL (ref 1.5–2.5)

## 2014-01-28 LAB — PHOSPHORUS: Phosphorus: 2.7 mg/dL (ref 2.3–4.6)

## 2014-01-28 LAB — I-STAT CG4 LACTIC ACID, ED: Lactic Acid, Venous: 1.28 mmol/L (ref 0.5–2.2)

## 2014-01-28 MED ORDER — AZITHROMYCIN 500 MG PO TABS
500.0000 mg | ORAL_TABLET | ORAL | Status: DC
  Filled 2014-01-28: qty 1

## 2014-01-28 MED ORDER — SODIUM CHLORIDE 0.9 % IV BOLUS (SEPSIS): 1000.0000 mL | Freq: Once | INTRAVENOUS | Status: DC

## 2014-01-28 MED ORDER — SODIUM CHLORIDE 0.9 % IV SOLN
INTRAVENOUS | Status: AC
  Administered 2014-01-29: 05:00:00 via INTRAVENOUS

## 2014-01-28 MED ORDER — CEFTRIAXONE SODIUM 1 G IJ SOLR
1.0000 g | INTRAMUSCULAR | Status: DC
  Administered 2014-01-28 – 2014-01-29 (×2): 1 g via INTRAVENOUS
  Filled 2014-01-28 (×3): qty 10

## 2014-01-28 MED ORDER — LEVOFLOXACIN IN D5W 750 MG/150ML IV SOLN: 750.0000 mg | Freq: Once | INTRAVENOUS | Status: DC

## 2014-01-28 MED ORDER — HEPARIN SODIUM (PORCINE) 5000 UNIT/ML IJ SOLN: 5000.0000 [IU] | Freq: Three times a day (TID) | INTRAMUSCULAR | Status: DC

## 2014-01-28 MED ORDER — CETYLPYRIDINIUM CHLORIDE 0.05 % MT LIQD
7.0000 mL | Freq: Two times a day (BID) | OROMUCOSAL | Status: DC
  Administered 2014-01-30: 7 mL via OROMUCOSAL

## 2014-01-28 MED ORDER — ENOXAPARIN SODIUM 40 MG/0.4ML ~~LOC~~ SOLN
40.0000 mg | SUBCUTANEOUS | Status: DC
  Administered 2014-01-28 – 2014-01-29 (×2): 40 mg via SUBCUTANEOUS
  Filled 2014-01-28 (×3): qty 0.4

## 2014-01-28 MED ORDER — DEXTROSE 5 % IV SOLN
500.0000 mg | INTRAVENOUS | Status: DC
  Administered 2014-01-28 – 2014-01-29 (×2): 500 mg via INTRAVENOUS
  Filled 2014-01-28 (×3): qty 500

## 2014-01-28 MED ORDER — SODIUM CHLORIDE 0.9 % IV SOLN
INTRAVENOUS | Status: AC
Start: 2014-01-28 — End: 2014-01-29
  Administered 2014-01-28: 18:00:00 via INTRAVENOUS

## 2014-01-28 MED ORDER — DONEPEZIL HCL 5 MG PO TABS
5.0000 mg | ORAL_TABLET | Freq: Every day | ORAL | Status: DC
  Administered 2014-01-29: 5 mg via ORAL
  Filled 2014-01-28 (×3): qty 1

## 2014-01-28 NOTE — H&P (Signed)
Date: 01/28/2014               Patient Name:  Alexander Holder MRN: [id number]  DOB: 29-Oct-1921 Age / Sex: 80 y.o., male   PCP: Florentina Jenny, MD         Medical Service: Internal Medicine Teaching Service         Attending Physician: Dr. Earl Lagos, MD    First Contact: Dr. Rich Number Pager: [phone]  Second Contact: Dr. Charlsie Merles Pager: [phone]       After Hours (After 5p/  First Contact Pager: [phone]  weekends / holidays): Second Contact Pager: [phone]   Chief Complaint: Generalized weakness, confusion  History of Present Illness: [name] is a 80yo man w/ PMHx of MI (at 80 yo) and Parkinson's disease, and a resident at [facility] who was brought to the ED by EMS after being noted to be somnolent by his caretaker. Patient is not a good historian. Patient reports that he feels weak and just "does not feel well." He notes trouble sleeping, an 11 lb weight loss in the last 2 months despite a good appetite, achiness, and dysuria. He denies fever, chills, night sweats, chest pain, SOB, cough, wheezing, abdominal pain, diarrhea, and hematuria. Daughter was at the bedside and reports the patient seemed confused today and "slow moving" compared to his normal self.   In the ED, patient had a U/A that shows a moderate amt of Hbg, a large amt of leukocytes, and few bacteria. CXR shows an infiltrate in the left lung base concerning for pneumonia. WBC count on admission was 6.6. Lactic acid normal at 1.28. Troponin negative x 1.   Patient had CT Abd/Pelvis on 07/09/13 that showed multiple cystic lesions throughout both kidneys as well as a 2.9 cm soft tissue mass in the lower pole of the right kidney. Patient has hx of hematuria and mass was suspicious for underlying neoplasm. There are no notes showing further work up of the mass.   Meds: Current Facility-Administered Medications  Medication Dose Route Frequency Provider Last Rate Last Dose  . 0.9 %  sodium chloride infusion    Intravenous STAT Tiffany G Greene, PA-C      . donepezil (ARICEPT) tablet 5 mg  5 mg Oral QHS Dow Adolph, MD      . levofloxacin (LEVAQUIN) IVPB 750 mg  750 mg Intravenous Once Dorthula Matas, PA-C        Allergies: Allergies as of 01/28/2014  . (No Known Allergies)   Past Medical History  Diagnosis Date  . Paralysis agitans   . Unspecified glaucoma   . Orthostatic hypotension   . Other dysphagia   . Other specified cardiac dysrhythmias(427.89)   . Other and unspecified hyperlipidemia   . Edema   . Abnormality of gait   . Seborrheic dermatitis, unspecified   . Impacted cerumen   . Mild cognitive impairment, so stated   . Other family disruption     Wife in ALF with dementia  . Parkinson disease    History reviewed. No pertinent past surgical history. Family History  Problem Relation Age of Onset  . Cancer Brother   . Diabetes Mother    History   Social History  . Marital Status: Married    Spouse Name: [name]    Number of Children: [name]  . Years of Education: [name]   Occupational History  . Not on file.   Social History Main Topics  . Smoking status: Never Smoker   .  Smokeless tobacco: Not on file  . Alcohol Use: No  . Drug Use: No  . Sexual Activity: Not on file   Other Topics Concern  . Not on file   Social History Narrative  . No narrative on file    Review of Systems: General: See HPI HEENT: Denies headaches, ear pain, changes in vision, rhinorrhea, sore throat CV: Denies palpitations, orthopnea Pulm: See HPI GI: Denies nausea, vomiting, diarrhea, constipation, melena, hematochezia GU: See HPI Msk: Denies muscle cramps, joint pains Neuro: Denies numbness, tingling Skin: Denies rashes  Physical Exam: Blood pressure 151/75, pulse 52, temperature 97.8 F (36.6 C), temperature source Oral, resp. rate 15, SpO2 96.00%. General: appears stated age, sitting up in bed, NAD HEENT: [place], small wound on right forehead, no bleeding. EOMI, conjunctivitis  with left worse than right, crusting at edges of eyes.  Neck: supple, no JVD, no lymphadenopathy CV: bradycardic, normal S1/S2, no m/g/r Pulm: mild crackles heard at left lung base, breaths non-labored, no wheezing Abd: BS+, soft, non-distended, non-tender Ext: warm, no edema, moves all. Several bruises noted on upper and lower extremities.  Neuro: alert and oriented x 2, pt seems confused, slow to answer questions, CNs II-XII intact, strength 4/5 in upper and lower extremities bilaterally  Lab results: Basic Metabolic Panel:  Recent Labs  10/96/04 1336  NA 138  K 4.3  CL 102  CO2 25  GLUCOSE 95  BUN 26*  CREATININE 0.95  CALCIUM 9.3   Liver Function Tests:  Recent Labs  01/28/14 1336  AST 24  ALT 8  ALKPHOS 74  BILITOT 0.3  PROT 6.7  ALBUMIN 3.1*   No results found for this basename: LIPASE, AMYLASE,  in the last 72 hours No results found for this basename: AMMONIA,  in the last 72 hours CBC:  Recent Labs  01/28/14 1336  WBC 6.6  HGB 13.1  HCT 38.6*  MCV 92.6  PLT 211   Cardiac Enzymes: No results found for this basename: CKTOTAL, CKMB, CKMBINDEX, TROPONINI,  in the last 72 hours BNP: No results found for this basename: PROBNP,  in the last 72 hours D-Dimer: No results found for this basename: DDIMER,  in the last 72 hours CBG: No results found for this basename: GLUCAP,  in the last 72 hours Hemoglobin A1C: No results found for this basename: HGBA1C,  in the last 72 hours Fasting Lipid Panel: No results found for this basename: CHOL, HDL, LDLCALC, TRIG, CHOLHDL, LDLDIRECT,  in the last 72 hours Thyroid Function Tests: No results found for this basename: TSH, T4TOTAL, FREET4, T3FREE, THYROIDAB,  in the last 72 hours Anemia Panel: No results found for this basename: VITAMINB12, FOLATE, FERRITIN, TIBC, IRON, RETICCTPCT,  in the last 72 hours Coagulation: No results found for this basename: LABPROT, INR,  in the last 72 hours Urine Drug Screen: Drugs  of Abuse  No results found for this basename: labopia, cocainscrnur, labbenz, amphetmu, thcu, labbarb    Alcohol Level: No results found for this basename: ETH,  in the last 72 hours Urinalysis:  Recent Labs  01/28/14 1458  COLORURINE YELLOW  LABSPEC 1.021  PHURINE 6.0  GLUCOSEU NEGATIVE  HGBUR MODERATE*  BILIRUBINUR NEGATIVE  KETONESUR NEGATIVE  PROTEINUR NEGATIVE  UROBILINOGEN 0.2  NITRITE NEGATIVE  LEUKOCYTESUR LARGE*     Imaging results:  Dg Chest 2 View  01/28/2014   CLINICAL DATA:  Weakness and hypotension.  EXAM: CHEST  2 VIEW  COMPARISON:  11/24/2013  FINDINGS: The cardiac silhouette is within  normal limits for size. Thoracic aorta is again seen to be tortuous. Thoracic aortic calcification is noted. The patient has taken a greater inspiration than on the prior study. Right basilar opacity on the prior study has resolved. A small region of hazy opacity is now seen in the left lung on the PA image, without definite correlate on the lateral. There is a new, small right pleural effusion. No pneumothorax is seen.  IMPRESSION: 1. Question developing infectious infiltrate in the left lung base. 2. New, small right pleural effusion.   Electronically Signed   By: Sebastian Ache   On: 01/28/2014 14:30    Other results: EKG: sinus bradycardia with 1st degree AV block, T wave inversions in I, avL, and V2 and ST elevations in III and avF, no previous to compare  Assessment & Plan: [name] is a 80yo man w/ PMHx of MI (at 80 yo) and Parkinson's disease, and a resident at [facility] who was brought to the ED for confusion and generalized weakness found to have a left lower lobe pneumonia.  1. Healthcare-associated Pneumonia: Pt presented with generalized weakness and confusion. He lives at an ALF. Vital signs stable on admission, was bradycardic in 50s. Found to have left lower lobe pneumonia on CXR. He does not meet SIRS criteria as he is afebrile, not tachycardic, not tachypneic,  and has WBC count of 6.6. Will treat as healthcare-associated pneumonia. - Azithromycin 500 mg PO Q24H - Ceftriaxone 1g IV Q24H - Cardiac monitoring - Blood cultures x 2 - Sputum culture - Strep pneumo antigen - Legionella antigen - CBC and bmet in AM  2. EKG Changes: EKG showed sinus bradycardia with 1st degree AV block, T wave inversions in I, avL, and V2 and ST elevations in III and avF, no previous to compare. Patient does not complain of chest pain, palpitations, SOB.  - Repeat EKG in AM - Troponins x 3  3. Parkinson's Disease: Patient takes Aricept 5 mg daily at home. Also has history of difficulty swallowing 2/2 Parkinson's (?). - Continue Aricept - SLP swallow eval  4. Right Renal Mass: Pt found to have right renal mass on CT Abd/Pelvis on 07/09/13. CT showed multiple cystic lesions throughout both kidneys and a 2.9 cm soft tissue mass in lower pole of right kidney suspicious for neoplasm. Patient apparently had history of hematuria at the time. No further work up for renal mass found in records. Patient notes dysuria for 1 year and "discolored" urine.  - Pt will likely need outpatient workup  Diet: NPO for now, needs swallow evaluation DVT/PE PPx: Heparin SQ Dispo: Disposition is deferred at this time, awaiting improvement of current medical problems. Anticipated discharge in approximately 1-2 day(s).   The patient does have a current PCP Florentina Jenny, MD) and does need an [facility] hospital follow-up appointment after discharge.  The patient does not have transportation limitations that hinder transportation to clinic appointments.  Signed: Rich Number, MD 01/28/2014, 4:47 PM

## 2014-01-28 NOTE — Progress Notes (Signed)
Pt BP elevated. On call MD notified. No new orders given at this time. Will recheck BP in 30 mins.

## 2014-01-28 NOTE — Progress Notes (Signed)
80 yr old male prestents to the ED with confusion and "not feeling good." from [facility]. His PMH includes Parkingson's and an ME. A CT revealed several cysts in each kidney thought to be benign and a mass which could be a neoplasm. Pharmacy was to dose lovenox for DVT prophylaxis. A CT revealed an infiltration in his lungs, which is being treated as a healthcare associated pneumonia with azithromycin and cephtriaxone. The pt also has a UTI, cysts on his kidneys and a mass in one kidney which could be a neoplasm. PMH includes an MI and Parkinson's.

## 2014-01-28 NOTE — ED Provider Notes (Signed)
CSN: [id number]     Arrival date & time 01/28/14  1323 History   First MD Initiated Contact with Patient 01/28/14 1416     Chief Complaint  Patient presents with  . Weakness  . Hypotension     (Consider location/radiation/quality/duration/timing/severity/associated sxs/prior Treatment) HPI  Patient to the ER from [facility] facility with PMH significant Parkinson's Disease. He is bib his daughter who is concerned because he needs help ambulating to the restroom, uses a walker. According to his daughter he has been lethargic and not able to sit up and get out of bed how he normally does, he has been coughing and reports feeling groggy. No focal weakness or cognitive changes. He is alert and oriented and follows commands.  The nurses were concerned that he was hypotensive but his BP;s range from 120/64- 162/73 with no intervention here in the ED. The patient does not have any other complaints at this time. He has not had a temperature that he knows of. Pt talks to me with his eyes closed until prompted to open them, he kept them open from then on.  Past Medical History  Diagnosis Date  . Paralysis agitans   . Unspecified glaucoma   . Orthostatic hypotension   . Other dysphagia   . Other specified cardiac dysrhythmias(427.89)   . Other and unspecified hyperlipidemia   . Edema   . Abnormality of gait   . Seborrheic dermatitis, unspecified   . Impacted cerumen   . Mild cognitive impairment, so stated   . Other family disruption     Wife in ALF with dementia  . Parkinson disease    History reviewed. No pertinent past surgical history. Family History  Problem Relation Age of Onset  . Cancer Brother   . Diabetes Mother    History  Substance Use Topics  . Smoking status: Never Smoker   . Smokeless tobacco: Not on file  . Alcohol Use: No    Review of Systems  Respiratory: Positive for cough.   Neurological: Positive for weakness.      Allergies  Review of  patient's allergies indicates no known allergies.  Home Medications   Prior to Admission medications   Medication Sig Start Date End Date Taking? Authorizing Provider  aspirin 81 MG tablet Take 81 mg by mouth daily.    Historical Provider, MD  azithromycin (ZITHROMAX) 250 MG tablet Take 1 tablet (250 mg total) by mouth daily. Take first 2 tablets together, then 1 every day until finished. 11/24/13   Derwood Kaplan, MD  benzonatate (TESSALON) 100 MG capsule Take 1 capsule (100 mg total) by mouth every 8 (eight) hours. 11/24/13   Ankit Nanavati, MD  bimatoprost (LUMIGAN) 0.01 % SOLN Place 1 drop into the right eye at bedtime.    Historical Provider, MD  carbidopa-levodopa (SINEMET IR) 25-250 MG per tablet Medication must be given 30 minutes before meals and 8 pm. 10/28/13   Ronal Fear, NP  cephALEXin (KEFLEX) 500 MG capsule Take 1 capsule (500 mg total) by mouth 2 (two) times daily. 11/24/13   Derwood Kaplan, MD  donepezil (ARICEPT) 5 MG tablet Take 5 mg by mouth at bedtime.    Historical Provider, MD  dorzolamide-timolol (COSOPT) 22.3-6.8 MG/ML ophthalmic solution Place 1 drop into both eyes every 12 (twelve) hours.  02/05/13   Historical Provider, MD  Ergocalciferol (VITAMIN D2) 2000 UNITS TABS Take 2,000 Units by mouth daily.    Historical Provider, MD  folic acid (FOLVITE) 800 MCG tablet  Take 800 mcg by mouth daily.    Historical Provider, MD  furosemide (LASIX) 20 MG tablet Take 20 mg by mouth daily as needed for fluid.    Historical Provider, MD  guaiFENesin-dextromethorphan (ROBITUSSIN DM) 100-10 MG/5ML syrup Take 10 mLs by mouth every 6 (six) hours as needed for cough.    Historical Provider, MD  ibuprofen (ADVIL,MOTRIN) 200 MG tablet Take 200 mg by mouth every 6 (six) hours as needed for mild pain.    Historical Provider, MD  Multiple Vitamins-Minerals (MULTIVITAMIN PO) Take 1 tablet by mouth daily.    Historical Provider, MD  niacin 500 MG tablet Take 500 mg by mouth daily.    Historical Provider,  MD  Omega-3 Fatty Acids (FISH OIL) 1200 MG CAPS Take 1,200 mg by mouth daily.    Historical Provider, MD  predniSONE (DELTASONE) 50 MG tablet Take 1 tablet (50 mg total) by mouth daily. 11/24/13   Ankit Nanavati, MD   BP 162/73  Pulse 52  Temp(Src) 97.8 F (36.6 C) (Oral)  Resp 13  SpO2 100% Physical Exam  Nursing note and vitals reviewed. Constitutional: He appears well-developed and well-nourished. No distress.  HENT:  Head: Normocephalic and atraumatic.  Eyes: Pupils are equal, round, and reactive to light.  Neck: Normal range of motion. Neck supple.  Cardiovascular: Normal rate and regular rhythm.   Pulmonary/Chest: Effort normal. He has rhonchi (bilateral lower lung basis).  Abdominal: Soft. Bowel sounds are normal. He exhibits no distension. There is no tenderness. There is no rebound and no guarding.  Neurological: He is alert. No cranial nerve deficit or sensory deficit.  Global weakness but symmetrical upper and lower bilateral strengths. Pt able to raise both arms and legs off of the bed briefly.  Skin: Skin is warm and dry.    ED Course  Procedures (including critical care time) Labs Review Labs Reviewed  CBC - Abnormal; Notable for the following:    RBC 4.17 (*)    HCT 38.6 (*)    All other components within normal limits  COMPREHENSIVE METABOLIC PANEL - Abnormal; Notable for the following:    BUN 26 (*)    Albumin 3.1 (*)    GFR calc non Af Amer 72 (*)    GFR calc Af Amer 83 (*)    All other components within normal limits  URINALYSIS, ROUTINE W REFLEX MICROSCOPIC  I-STAT TROPOININ, ED  I-STAT CG4 LACTIC ACID, ED    Imaging Review Dg Chest 2 View  01/28/2014   CLINICAL DATA:  Weakness and hypotension.  EXAM: CHEST  2 VIEW  COMPARISON:  11/24/2013  FINDINGS: The cardiac silhouette is within normal limits for size. Thoracic aorta is again seen to be tortuous. Thoracic aortic calcification is noted. The patient has taken a greater inspiration than on the prior  study. Right basilar opacity on the prior study has resolved. A small region of hazy opacity is now seen in the left lung on the PA image, without definite correlate on the lateral. There is a new, small right pleural effusion. No pneumothorax is seen.  IMPRESSION: 1. Question developing infectious infiltrate in the left lung base. 2. New, small right pleural effusion.   Electronically Signed   By: Sebastian Ache   On: 01/28/2014 14:30     EKG Interpretation None      MDM   Final diagnoses:  CAP (community acquired pneumonia)    Patients laboratory work-up and VS do not suggest sepsis but he is much weaker than  his baseline and has elevated BUN most likely due to dehydration. The patient has been coughing and his chest xray shows a left lower lung base pneumonia. He lives in an independent facility and does not meet criteria hospital acquired pneumonia. Dr. Deretha Emory made aware of diagnosis and plan, aware patient being admitted for CAP.  I spoke with the unassigned admission team. [facility] medicine has agreed to admit to Tele, inpatient, Dr. Heide Spark, [facility]. Him and his daughter have been made aware and understand the plan and are agreeable.   Filed Vitals:   01/28/14 1430  BP: 162/73  Pulse: 52  Temp:   Resp: [address], PA-C 01/28/14 1528

## 2014-01-28 NOTE — ED Notes (Signed)
Pt here with family from [facility] x/o generalized weakness and lethargy and hypotension; pt appears lethargic but denies pain

## 2014-01-29 ENCOUNTER — Encounter (HOSPITAL_COMMUNITY): Payer: Self-pay | Admitting: Radiology

## 2014-01-29 ENCOUNTER — Inpatient Hospital Stay (HOSPITAL_COMMUNITY): Payer: Medicare HMO

## 2014-01-29 DIAGNOSIS — J189 Pneumonia, unspecified organism: Principal | ICD-10-CM

## 2014-01-29 DIAGNOSIS — N289 Disorder of kidney and ureter, unspecified: Secondary | ICD-10-CM

## 2014-01-29 DIAGNOSIS — R4182 Altered mental status, unspecified: Secondary | ICD-10-CM

## 2014-01-29 DIAGNOSIS — G20 Parkinson's disease: Secondary | ICD-10-CM

## 2014-01-29 LAB — COMPREHENSIVE METABOLIC PANEL
ALT: 19 U/L (ref 0–53)
AST: 20 U/L (ref 0–37)
Albumin: 2.8 g/dL — ABNORMAL LOW (ref 3.5–5.2)
Alkaline Phosphatase: 63 U/L (ref 39–117)
Anion gap: 10 (ref 5–15)
BUN: 20 mg/dL (ref 6–23)
CALCIUM: 8.7 mg/dL (ref 8.4–10.5)
CO2: 25 mEq/L (ref 19–32)
Chloride: 106 mEq/L (ref 96–112)
Creatinine, Ser: 0.85 mg/dL (ref 0.50–1.35)
GFR calc Af Amer: 87 mL/min — ABNORMAL LOW (ref >90)
GFR calc non Af Amer: 75 mL/min — ABNORMAL LOW (ref >90)
Glucose, Bld: 79 mg/dL (ref 70–99)
Potassium: 3.8 mEq/L (ref 3.7–5.3)
SODIUM: 141 meq/L (ref 137–147)
TOTAL PROTEIN: 5.9 g/dL — AB (ref 6.0–8.3)
Total Bilirubin: 0.3 mg/dL (ref 0.3–1.2)

## 2014-01-29 LAB — BLOOD GAS, ARTERIAL
Acid-Base Excess: 0.7 mmol/L (ref 0.0–2.0)
BICARBONATE: 24.5 meq/L — AB (ref 20.0–24.0)
Drawn by: 23588
FIO2: 0.21 %
O2 Saturation: 95.3 %
PATIENT TEMPERATURE: 98.6
TCO2: 25.6 mmol/L (ref 0–100)
pCO2 arterial: 37.1 mmHg (ref 35.0–45.0)
pH, Arterial: 7.434 (ref 7.350–7.450)
pO2, Arterial: 72.7 mmHg — ABNORMAL LOW (ref 80.0–100.0)

## 2014-01-29 LAB — CBC WITH DIFFERENTIAL/PLATELET
BASOS PCT: 0 % (ref 0–1)
Basophils Absolute: 0 10*3/uL (ref 0.0–0.1)
EOS ABS: 0.3 10*3/uL (ref 0.0–0.7)
EOS PCT: 5 % (ref 0–5)
HCT: 37 % — ABNORMAL LOW (ref 39.0–52.0)
Hemoglobin: 12.7 g/dL — ABNORMAL LOW (ref 13.0–17.0)
LYMPHS ABS: 1.6 10*3/uL (ref 0.7–4.0)
Lymphocytes Relative: 26 % (ref 12–46)
MCH: 31.7 pg (ref 26.0–34.0)
MCHC: 34.3 g/dL (ref 30.0–36.0)
MCV: 92.3 fL (ref 78.0–100.0)
Monocytes Absolute: 0.5 10*3/uL (ref 0.1–1.0)
Monocytes Relative: 9 % (ref 3–12)
NEUTROS PCT: 60 % (ref 43–77)
Neutro Abs: 3.7 10*3/uL (ref 1.7–7.7)
PLATELETS: 182 10*3/uL (ref 150–400)
RBC: 4.01 MIL/uL — AB (ref 4.22–5.81)
RDW: 14 % (ref 11.5–15.5)
WBC: 6.1 10*3/uL (ref 4.0–10.5)

## 2014-01-29 LAB — GLUCOSE, CAPILLARY
GLUCOSE-CAPILLARY: 76 mg/dL (ref 70–99)
Glucose-Capillary: 68 mg/dL — ABNORMAL LOW (ref 70–99)
Glucose-Capillary: 124 mg/dL — ABNORMAL HIGH (ref 70–99)

## 2014-01-29 LAB — RAPID URINE DRUG SCREEN, HOSP PERFORMED
Amphetamines: NOT DETECTED
BENZODIAZEPINES: NOT DETECTED
Barbiturates: NOT DETECTED
COCAINE: NOT DETECTED
Opiates: NOT DETECTED
Tetrahydrocannabinol: NOT DETECTED

## 2014-01-29 LAB — TSH: TSH: 2.01 u[IU]/mL (ref 0.350–4.500)

## 2014-01-29 LAB — LEGIONELLA ANTIGEN, URINE: Legionella Antigen, Urine: NEGATIVE

## 2014-01-29 LAB — TROPONIN I: Troponin I: <0.3 ng/mL (ref <0.30)

## 2014-01-29 MED ORDER — DEXTROSE 50 % IV SOLN: 25.0000 mL | Freq: Once | INTRAVENOUS | Status: AC | PRN

## 2014-01-29 MED ORDER — CARBIDOPA-LEVODOPA 25-250 MG PO TABS
1.0000 | ORAL_TABLET | ORAL | Status: DC
  Administered 2014-01-29 – 2014-01-30 (×4): 1 via ORAL
  Filled 2014-01-29 (×7): qty 1

## 2014-01-29 MED ORDER — DEXTROSE 50 % IV SOLN
INTRAVENOUS | Status: AC
  Administered 2014-01-29: 25 mL
  Filled 2014-01-29: qty 50

## 2014-01-29 MED ORDER — DEXTROSE-NACL 5-0.9 % IV SOLN
INTRAVENOUS | Status: AC
  Administered 2014-01-29: 12:00:00 via INTRAVENOUS

## 2014-01-29 NOTE — Progress Notes (Signed)
SLP Cancellation Note  Patient Details Name: Alexander Holder MRN: [id number] DOB: 03/13/1922   Cancelled treatment:       Reason Eval/Treat Not Completed: Fatigue/lethargy limiting ability to participate. Pt unable to be aroused despite Max multimodal stimulation provided by SLP. Per daughter at bedside, patient has been like this throughout the morning. She does not notice any difficulty with his swallowing at baseline, but reports that the nurse at [facility] had raised some concerns.   SLP to return as able to complete bedside swallowing assessment. Please page SLP should patient become more alert today [phone]).   Maxcine Ham, M.A. CCC-SLP [phone]  Maxcine Ham 01/29/2014, 11:14 AM

## 2014-01-29 NOTE — Progress Notes (Signed)
RT in room to obtain ABG, pt in CT scan at this time

## 2014-01-29 NOTE — Progress Notes (Signed)
MD called. Pt not diabetic so CBG checks were discontinued.

## 2014-01-29 NOTE — Progress Notes (Signed)
Pt seen and examined with Dr. Beckie Salts. Please refer to resident note for details  In brief, 80 y/o male with PMH of Parkinson's disease, ? MI at 42 p/w somnolence and fatigue from assisted living. History obtained from daughter as pt was difficult to arouse, Per daughter pt was weak and did not appear well. Also c/o unintentional weight loss over the last 2 months. No fevers/chills, no cp, no SOB, no abd pain, no cough, no wheezing. Pt has a history of 3 cm renal mass seen on CT in 2/15. Remaining ROS negative  Exam: Gen:  Pt is difficult to arouse - only responsive to painful stimuli Cardio: Bradycardic Lungs: CTA b/l Abd: soft, non distendeed, BS + Ext: echymosis + over wrists, no edema   Assessment and Plan: 80 y/o male with likely PNA now with AMS   AMS: - Uncertain etiology of somnolence - Stat head CT done- no evidence of bleed - Will check stat ABG- r/o hypercapnea - BS is 124. Will consider neuro consult if no improvement - Pt with h/o Parkinson's - Will attempt to resume sinemet  PNA: - c/w rocephin, zithromax - f/u blood cx, urine lgeionella ag  Renal mass: - likely malignancy. Will d/w pt, family about wishes regarding pursuing Rx - If they wish to pursue aggressive Rx will refer to uro as outpatient for further w/u  EKG changes: - troponins negative. No symptoms of active cardiac disease - Will monitor

## 2014-01-29 NOTE — Progress Notes (Signed)
Subjective: Patient unresponsive this AM. Nurse reports that he has been sleeping since last night and has not awoken. Pt responded to sternal rub, however unarousable. Blood glucose 68 this AM, pt given D5 injection. Daughter at bedside reports that occasionally patient will be in deep sleep, but usually she is able to wake him.  Objective: Vital signs in last 24 hours: Filed Vitals:   01/28/14 2058 01/28/14 2233 01/29/14 0504 01/29/14 0959  BP: 167/98 154/75 137/77 149/72  Pulse: 54  53 52  Temp: 98 F (36.7 C)  97.8 F (36.6 C) 98.2 F (36.8 C)  TempSrc: Oral  Axillary Axillary  Resp: Height:      Weight:      SpO2: 98%  98% 97%   Weight change:   Intake/Output Summary (Last 24 hours) at 01/29/14 1118 Last data filed at 01/29/14 0511  Gross per 24 hour  Intake      0 ml  Output   1000 ml  Net  -1000 ml   Physical Exam: General: patient laying in bed, unarousable, responsive only to sternal rub HEENT: [place], small wound on right side of head, conjunctivitis present CV: bradycardic, normal S1/S2, no m/g/r Pulm: CTA bilaterally Ext: left hand pill-rolling tremor, multiple bruises present on upper and lower extremities Neuro: unarousable, responded to sternal rub but still not able to awaken  Lab Results: Basic Metabolic Panel:  Recent Labs Lab 01/28/14 1336 01/28/14 1900 01/29/14 0443  NA 138  --  141  K 4.3  --  3.8  CL 102  --  106  CO2 25  --  25  GLUCOSE 95  --  79  BUN 26*  --  20  CREATININE 0.95  --  0.85  CALCIUM 9.3  --  8.7  MG  --  1.9  --   PHOS  --  2.7  --    Liver Function Tests:  Recent Labs Lab 01/28/14 1336 01/29/14 0443  AST 24 20  ALT 8 19  ALKPHOS 74 63  BILITOT 0.3 0.3  PROT 6.7 5.9*  ALBUMIN 3.1* 2.8*   No results found for this basename: LIPASE, AMYLASE,  in the last 168 hours No results found for this basename: AMMONIA,  in the last 168 hours CBC:  Recent Labs Lab 01/28/14 1336 01/29/14 0443  WBC 6.6 6.1    NEUTROABS  --  3.7  HGB 13.1 12.7*  HCT 38.6* 37.0*  MCV 92.6 92.3  PLT 211 182   Cardiac Enzymes:  Recent Labs Lab 01/28/14 1900 01/28/14 2231 01/29/14 0443  TROPONINI <0.30 <0.30 <0.30   BNP: No results found for this basename: PROBNP,  in the last 168 hours D-Dimer: No results found for this basename: DDIMER,  in the last 168 hours CBG:  Recent Labs Lab 01/28/14 2026 01/29/14 0015  GLUCAP 126* 76   Hemoglobin A1C: No results found for this basename: HGBA1C,  in the last 168 hours Fasting Lipid Panel: No results found for this basename: CHOL, HDL, LDLCALC, TRIG, CHOLHDL, LDLDIRECT,  in the last 168 hours Thyroid Function Tests: No results found for this basename: TSH, T4TOTAL, FREET4, T3FREE, THYROIDAB,  in the last 168 hours Coagulation:  Recent Labs Lab 01/28/14 1900  LABPROT 14.6  INR 1.14   Anemia Panel: No results found for this basename: VITAMINB12, FOLATE, FERRITIN, TIBC, IRON, RETICCTPCT,  in the last 168 hours Urine Drug Screen: Drugs of Abuse  No results found for this basename: labopia,  cocainscrnur, labbenz, amphetmu, thcu, labbarb    Alcohol Level: No results found for this basename: ETH,  in the last 168 hours Urinalysis:  Recent Labs Lab 01/28/14 1458  COLORURINE YELLOW  LABSPEC 1.021  PHURINE 6.0  GLUCOSEU NEGATIVE  HGBUR MODERATE*  BILIRUBINUR NEGATIVE  KETONESUR NEGATIVE  PROTEINUR NEGATIVE  UROBILINOGEN 0.2  NITRITE NEGATIVE  LEUKOCYTESUR LARGE*    Micro Results: Recent Results (from the past 240 hour(s))  CULTURE, BLOOD (ROUTINE X 2)     Status: None   Collection Time    01/28/14  6:50 PM      Result Value Ref Range Status   Specimen Description BLOOD RIGHT HAND   Final   Special Requests BOTTLES DRAWN AEROBIC ONLY 10CC   Final   Culture  Setup Time     Final   Value: 01/28/2014 22:41     Performed at [facility]   Culture     Final   Value:        BLOOD CULTURE RECEIVED NO GROWTH TO DATE CULTURE WILL BE  HELD FOR 5 DAYS BEFORE ISSUING A FINAL NEGATIVE REPORT     Performed at [facility]   Report Status PENDING   Incomplete  CULTURE, BLOOD (ROUTINE X 2)     Status: None   Collection Time    01/28/14  7:00 PM      Result Value Ref Range Status   Specimen Description BLOOD RIGHT ARM   Final   Special Requests BOTTLES DRAWN AEROBIC ONLY 10CC   Final   Culture  Setup Time     Final   Value: 01/28/2014 22:40     Performed at [facility]   Culture     Final   Value:        BLOOD CULTURE RECEIVED NO GROWTH TO DATE CULTURE WILL BE HELD FOR 5 DAYS BEFORE ISSUING A FINAL NEGATIVE REPORT     Performed at [facility]   Report Status PENDING   Incomplete   Studies/Results: Dg Chest 2 View  01/28/2014   CLINICAL DATA:  Weakness and hypotension.  EXAM: CHEST  2 VIEW  COMPARISON:  11/24/2013  FINDINGS: The cardiac silhouette is within normal limits for size. Thoracic aorta is again seen to be tortuous. Thoracic aortic calcification is noted. The patient has taken a greater inspiration than on the prior study. Right basilar opacity on the prior study has resolved. A small region of hazy opacity is now seen in the left lung on the PA image, without definite correlate on the lateral. There is a new, small right pleural effusion. No pneumothorax is seen.  IMPRESSION: 1. Question developing infectious infiltrate in the left lung base. 2. New, small right pleural effusion.   Electronically Signed   By: Sebastian Ache   On: 01/28/2014 14:30   Ct Head Wo Contrast  01/29/2014   CLINICAL DATA:  Altered mental status.  EXAM: CT HEAD WITHOUT CONTRAST  TECHNIQUE: Contiguous axial images were obtained from the base of the skull through the vertex without intravenous contrast.  COMPARISON:  None.  FINDINGS: There is no evidence of acute intracranial abnormality including infarct, hemorrhage, mass lesion, mass effect, midline shift or abnormal extra-axial fluid collection. No hydrocephalus or  pneumocephalus. The calvarium is intact. Mild atrophy and chronic microvascular ischemic change noted.  IMPRESSION: No acute abnormality.   Electronically Signed   By: Drusilla Kanner M.D.   On: 01/29/2014 10:50   Medications: I have reviewed the  patient's current medications. Scheduled Meds: . antiseptic oral rinse  7 mL Mouth Rinse BID  . azithromycin  500 mg Intravenous Q24H  . cefTRIAXone (ROCEPHIN)  IV  1 g Intravenous Q24H  . donepezil  5 mg Oral QHS  . enoxaparin (LOVENOX) injection  40 mg Subcutaneous Q24H   Continuous Infusions: . dextrose 5 % and 0.9% NaCl     PRN Meds:.dextrose Assessment/Plan: [name] is a 80yo man w/ PMHx of MI (at 80 yo) and Parkinson's disease, and a resident at [facility] who was brought to the ED for confusion and generalized weakness found to have a left lower lobe pneumonia.   1. Altered Mental Status: Patient unarousable this AM, only responsive to sternal rub. Pt continues to be bradycardic and systolic BP in 150-160s. According to daughter, this BP is significantly elevated compared to his baseline in 100s-110s. Concern for possible intracranial bleed. Daughter states patient is prone to falls but she does not know the last time he had a fall. CT Head was ordered STAT and was negative for any intracranial bleed. AMS unlikely due to metabolic abnormality since no electrolyte abnormalities present and WBC count has been normal at 6. No anion gap present. Blood sugar was slightly low this AM at 68, but patient was given D50 injection. Etiologies could be pneumonia infection and UTI vs. hypercarbia (CO2 25, normal on CMP) vs. benzodiazepine toxicity vs. Thyroid abnormality vs. Worsening of Parkinson's (?).  - UDS - TSH - f/u ABG  - Continue Rocephin and Azithromycin  2. Community Acquired Pneumonia: Pt presented with generalized weakness and confusion. Found to have left lower lobe pneumonia on CXR. Patient afebrile overnight, BP elevated in 150-160s  systolic, bradycardic in 50s. Will continue antibiotics.  - Azithromycin 500 mg PO Q24H  - Ceftriaxone 1g IV Q24H  - Cardiac monitoring  - Blood cultures x 2 --> no growth - Sputum culture  - Strep pneumo antigen --> negative - Legionella antigen --> pending - CBC and bmet in AM   3. EKG Changes: On admission, EKG showed sinus bradycardia with 1st degree AV block, T wave inversions in I, avL, and V2 and ST elevations in III and avF, no previous to compare. Patient did not complain of chest pain, palpitations, SOB yesterday. Patient still bradycardic this AM. Repeat EKG showed no changes compared to yesterday. Troponins negative x 3.   - Continue cardiac monitoring  4. Parkinson's Disease: Patient takes Aricept 5 mg daily at home. Also has history of difficulty swallowing 2/2 Parkinson's (?).  - Continue Aricept  - Consider restarting Sinemet once patient alert and oriented again - Pt needs swallow evaluation once awake  5. Right Renal Mass: Pt found to have right renal mass on CT Abd/Pelvis on 07/09/13. CT showed multiple cystic lesions throughout both kidneys and a 2.9 cm soft tissue mass in lower pole of right kidney suspicious for neoplasm. Patient apparently had history of hematuria at the time. No further work up for renal mass found in records. Patient notes dysuria for 1 year and "discolored" urine.  - Will set up outpatient workup if patient wishes to pursue   Diet: NPO  DVT/PE PPx: Heparin SQ  Dispo: Disposition is deferred at this time, awaiting improvement of current medical problems. Anticipated discharge in approximately 1-2 day(s).   The patient does have a current PCP Florentina Jenny, MD) and does need an [facility] hospital follow-up appointment after discharge.  The patient does not have transportation limitations that hinder transportation  to clinic appointments.  .Services Needed at time of discharge: Y = Yes, Blank = No PT:   OT:   RN:   Equipment:   Other:     LOS: 1 day    Rich Number, MD 01/29/2014, 11:18 AM

## 2014-01-29 NOTE — Evaluation (Signed)
Occupational Therapy Evaluation Patient Details Name: Alexander Holder MRN: [id number] DOB: [birth year] Today's Date: 01/29/2014    History of Present Illness Pt admitted with CAP.  Pt with hx of kidney cyst and mass and Parkinson's. Pt lives at [facility].   Clinical Impression   Pt was assisted for ambulation, ADL transfers, bathing and dressing at ALF.  Pt is likely near his baseline.  No acute needs.  Will defer further OT to ALF.    Follow Up Recommendations  Home health OT;Supervision/Assistance - 24 hour    Equipment Recommendations       Recommendations for Other Services       Precautions / Restrictions Precautions Precautions: Fall Restrictions Weight Bearing Restrictions: No      Mobility Bed Mobility Overal bed mobility: Needs Assistance Bed Mobility: Supine to Sit     Supine to sit: Mod assist;HOB elevated     General bed mobility comments: heavy reliance on bedrail  Transfers Overall transfer level: Needs assistance Equipment used: Rolling walker (2 wheeled) Transfers: Sit to/from Stand Sit to Stand: Min guard         General transfer comment: pulled up on walker, assist to guide hands to arm of chair and to control descent     Balance Overall balance assessment: Needs assistance Sitting-balance support: No upper extremity supported Sitting balance-Leahy Scale: Fair     Standing balance support: Bilateral upper extremity supported Standing balance-Leahy Scale: Poor                              ADL Overall ADL's : At baseline                                             Vision                     Perception     Praxis      Pertinent Vitals/Pain Pain Assessment: No/denies pain     Hand Dominance Right   Extremity/Trunk Assessment Upper Extremity Assessment Upper Extremity Assessment: Overall WFL for tasks assessed   Lower Extremity Assessment Lower Extremity  Assessment: Defer to PT evaluation       Communication Communication Communication: No difficulties;Other (comment) (slow response speed, uses short answers)   Cognition Arousal/Alertness: Awake/alert Behavior During Therapy: Flat affect Overall Cognitive Status: Within Functional Limits for tasks assessed (oriented x 4)                     General Comments       Exercises       Shoulder Instructions      Home Living Family/patient expects to be discharged to:: Assisted living                             Home Equipment: Walker - 2 wheels;Shower seat;Grab bars - tub/shower;Grab bars - toilet   Additional Comments: Pt participates in exercise program at facility per daughter.      Prior Functioning/Environment Level of Independence: Needs assistance  Gait / Transfers Assistance Needed: supervised with ambulation and RW ADL's / Homemaking Assistance Needed: Variable assist with bathing and dressing, self feeds.        OT Diagnosis: Generalized weakness   OT Problem List:  Decreased strength;Decreased activity tolerance;Impaired balance (sitting and/or standing);Decreased coordination;Decreased knowledge of use of DME or AE   OT Treatment/Interventions:      OT Goals(Current goals can be found in the care plan section) Acute Rehab OT Goals Patient Stated Goal: return to ALF  OT Frequency:     Barriers to D/C:            Co-evaluation PT/OT/SLP Co-Evaluation/Treatment: Yes Reason for Co-Treatment: For patient/therapist safety   OT goals addressed during session: ADL's and self-care      End of Session Equipment Utilized During Treatment: Gait belt Nurse Communication: Mobility status (ok for OOB)  Activity Tolerance: Patient tolerated treatment well Patient left: in chair;with call bell/phone within reach;with family/visitor present   Time: 1315-1345 OT Time Calculation (min): 30 min Charges:  OT General Charges $OT Visit: 1  Procedure OT Evaluation $Initial OT Evaluation Tier I: 1 Procedure OT Treatments $Self Care/Home Management : 8-22 mins G-Codes:    Evern Bio 01/29/2014, 1:59 PM [phone]

## 2014-01-29 NOTE — Evaluation (Signed)
Physical Therapy Evaluation Patient Details Name: Ukiah Trawick MRN: [id number] DOB: [birth year] Today's Date: 01/29/2014   History of Present Illness  Pt admitted with CAP.  Pt with hx of kidney cyst and mass and Parkinson's. Pt lives at [facility] at [facility].  Clinical Impression  Patient presents with functional limitations due to deficits listed in PT problem list (see below). Pt with generalized weakness from hospitalization and baseline deficits of impaired initiation, freezing and slow processing secondary to PD limiting safe mobility. Pt tolerated short distance ambulation today- limited- as pt wanted to eat lunch. Anticipate pt will improve safety/mobility with continued activity/exercise. Pt would benefit from acute PT to improve transfers, gait, balance and overall safe mobility so pt can maximize independence and return to PLOF.    Follow Up Recommendations Home health PT;Supervision for mobility/OOB    Equipment Recommendations  None recommended by PT    Recommendations for Other Services       Precautions / Restrictions Precautions Precautions: Fall Restrictions Weight Bearing Restrictions: No      Mobility  Bed Mobility Overal bed mobility: Needs Assistance Bed Mobility: Supine to Sit     Supine to sit: Mod assist;HOB elevated     General bed mobility comments: Use of bed rail and therapist's arm to assist with scooting bottom to EOB. Assist to intiate mobilizing BLEs to EOB. Increased time and cues. Freezing noted.   Transfers Overall transfer level: Needs assistance Equipment used: Rolling walker (2 wheeled) Transfers: Sit to/from Stand Sit to Stand: Min guard         General transfer comment: Pulled up on RW to stand. Required assist to place hands on arm rest of chair to control descent. Pt with difficulty descending into chair, required assist initiating transfer.  Ambulation/Gait Ambulation/Gait assistance: Min guard Ambulation  Distance (Feet): 18 Feet Assistive device: Rolling walker (2 wheeled) Gait Pattern/deviations: Step-through pattern;Decreased stride length;Shuffle Gait velocity: decreased   General Gait Details: Pt with short shuffling steps. Steady gait. Distance limited as pt wanted to eat lunch. Min guard for safety.  Stairs            Wheelchair Mobility    Modified Rankin (Stroke Patients Only)       Balance Overall balance assessment: Needs assistance Sitting-balance support: No upper extremity supported Sitting balance-Leahy Scale: Fair Sitting balance - Comments: Required total A to donn shoes sitting EOB.    Standing balance support: During functional activity Standing balance-Leahy Scale: Poor Standing balance comment: Requires BUE support during static and dynamic standing due to balance deficits and safety.                             Pertinent Vitals/Pain Pain Assessment: No/denies pain    Home Living Family/patient expects to be discharged to:: Assisted living               Home Equipment: Walker - 2 wheels;Shower seat;Grab bars - tub/shower;Grab bars - toilet Additional Comments: Pt participates in exercise program at facility per daughter.    Prior Function Level of Independence: Needs assistance   Gait / Transfers Assistance Needed: supervised with ambulation and RW, walks to dining hall daily.  ADL's / Homemaking Assistance Needed: Variable assist with bathing and dressing, self feeds.        Hand Dominance   Dominant Hand: Right    Extremity/Trunk Assessment   Upper Extremity Assessment: Overall WFL for tasks assessed  Lower Extremity Assessment: Generalized weakness         Communication   Communication: No difficulties;Other (comment)  Cognition Arousal/Alertness: Awake/alert Behavior During Therapy: Flat affect Overall Cognitive Status: History of cognitive impairments - at baseline (Pt with delayed response  time to answer questions. Decreased initiation. Requires tactile cues to initate task.)                      General Comments General comments (skin integrity, edema, etc.): Pt had not taken PD medication yet this AM.     Exercises        Assessment/Plan    PT Assessment Patient needs continued PT services  PT Diagnosis Generalized weakness   PT Problem List Decreased cognition;Decreased activity tolerance;Decreased balance;Decreased safety awareness;Decreased mobility  PT Treatment Interventions Balance training;Gait training;DME instruction;Cognitive remediation;Patient/family education;Functional mobility training;Therapeutic activities;Therapeutic exercise   PT Goals (Current goals can be found in the Care Plan section) Acute Rehab PT Goals Patient Stated Goal: return to ALF PT Goal Formulation: With patient/family Time For Goal Achievement: 02/12/14 Potential to Achieve Goals: Good    Frequency Min 3X/week   Barriers to discharge        Co-evaluation PT/OT/SLP Co-Evaluation/Treatment: Yes Reason for Co-Treatment: For patient/therapist safety PT goals addressed during session: Mobility/safety with mobility OT goals addressed during session: ADL's and self-care       End of Session Equipment Utilized During Treatment: Gait belt Activity Tolerance: Patient tolerated treatment well Patient left: in chair;with call bell/phone within reach;with family/visitor present Nurse Communication: Mobility status;Precautions         Time: 4098-1191 PT Time Calculation (min): 23 min   Charges:   PT Evaluation $Initial PT Evaluation Tier I: 1 Procedure PT Treatments $Therapeutic Activity: 8-22 mins   PT G CodesAlvie Heidelberg A 01/29/2014, 2:52 PM Alvie Heidelberg, PT, DPT [phone]

## 2014-01-29 NOTE — ED Provider Notes (Signed)
Medical screening examination/treatment/procedure(s) were conducted as a shared visit with non-physician practitioner(s) and myself.  I personally evaluated the patient during the encounter.   EKG Interpretation None      Results for orders placed during the hospital encounter of 01/28/14  CULTURE, BLOOD (ROUTINE X 2)      Result Value Ref Range   Specimen Description BLOOD RIGHT ARM     Special Requests BOTTLES DRAWN AEROBIC ONLY 10CC     Culture PENDING     Report Status PENDING    CULTURE, BLOOD (ROUTINE X 2)      Result Value Ref Range   Specimen Description BLOOD RIGHT HAND     Special Requests BOTTLES DRAWN AEROBIC ONLY 10CC     Culture PENDING     Report Status PENDING    CBC      Result Value Ref Range   WBC 6.6  4.0 - 10.5 K/uL   RBC 4.17 (*) 4.22 - 5.81 MIL/uL   Hemoglobin 13.1  13.0 - 17.0 g/dL   HCT 60.4 (*) 54.0 - 98.1 %   MCV 92.6  78.0 - 100.0 fL   MCH 31.4  26.0 - 34.0 pg   MCHC 33.9  30.0 - 36.0 g/dL   RDW 19.1  47.8 - 29.5 %   Platelets 211  150 - 400 K/uL  COMPREHENSIVE METABOLIC PANEL      Result Value Ref Range   Sodium 138  137 - 147 mEq/L   Potassium 4.3  3.7 - 5.3 mEq/L   Chloride 102  96 - 112 mEq/L   CO2 25  19 - 32 mEq/L   Glucose, Bld 95  70 - 99 mg/dL   BUN 26 (*) 6 - 23 mg/dL   Creatinine, Ser 6.21  0.50 - 1.35 mg/dL   Calcium 9.3  8.4 - 30.8 mg/dL   Total Protein 6.7  6.0 - 8.3 g/dL   Albumin 3.1 (*) 3.5 - 5.2 g/dL   AST 24  0 - 37 U/L   ALT 8  0 - 53 U/L   Alkaline Phosphatase 74  39 - 117 U/L   Total Bilirubin 0.3  0.3 - 1.2 mg/dL   GFR calc non Af Amer 72 (*) >90 mL/min   GFR calc Af Amer 83 (*) >90 mL/min   Anion gap 11  5 - 15  URINALYSIS, ROUTINE W REFLEX MICROSCOPIC      Result Value Ref Range   Color, Urine YELLOW  YELLOW   APPearance TURBID (*) CLEAR   Specific Gravity, Urine 1.021  1.005 - 1.030   pH 6.0  5.0 - 8.0   Glucose, UA NEGATIVE  NEGATIVE mg/dL   Hgb urine dipstick MODERATE (*) NEGATIVE   Bilirubin Urine  NEGATIVE  NEGATIVE   Ketones, ur NEGATIVE  NEGATIVE mg/dL   Protein, ur NEGATIVE  NEGATIVE mg/dL   Urobilinogen, UA 0.2  0.0 - 1.0 mg/dL   Nitrite NEGATIVE  NEGATIVE   Leukocytes, UA LARGE (*) NEGATIVE  URINE MICROSCOPIC-ADD ON      Result Value Ref Range   Squamous Epithelial / LPF RARE  RARE   WBC, UA TOO NUMEROUS TO COUNT  <3 WBC/hpf   RBC / HPF 3-6  <3 RBC/hpf   Bacteria, UA FEW (*) RARE  STREP PNEUMONIAE URINARY ANTIGEN      Result Value Ref Range   Strep Pneumo Urinary Antigen NEGATIVE  NEGATIVE  PHOSPHORUS      Result Value Ref Range   Phosphorus  2.7  2.3 - 4.6 mg/dL  MAGNESIUM      Result Value Ref Range   Magnesium 1.9  1.5 - 2.5 mg/dL  PROTIME-INR      Result Value Ref Range   Prothrombin Time 14.6  11.6 - 15.2 seconds   INR 1.14  0.00 - 1.49  TROPONIN I      Result Value Ref Range   Troponin I <0.30  <0.30 ng/mL  TROPONIN I      Result Value Ref Range   Troponin I <0.30  <0.30 ng/mL  TROPONIN I      Result Value Ref Range   Troponin I <0.30  <0.30 ng/mL  CBC WITH DIFFERENTIAL      Result Value Ref Range   WBC 6.1  4.0 - 10.5 K/uL   RBC 4.01 (*) 4.22 - 5.81 MIL/uL   Hemoglobin 12.7 (*) 13.0 - 17.0 g/dL   HCT 45.4 (*) 09.8 - 11.9 %   MCV 92.3  78.0 - 100.0 fL   MCH 31.7  26.0 - 34.0 pg   MCHC 34.3  30.0 - 36.0 g/dL   RDW 14.7  82.9 - 56.2 %   Platelets 182  150 - 400 K/uL   Neutrophils Relative % 60  43 - 77 %   Neutro Abs 3.7  1.7 - 7.7 K/uL   Lymphocytes Relative 26  12 - 46 %   Lymphs Abs 1.6  0.7 - 4.0 K/uL   Monocytes Relative 9  3 - 12 %   Monocytes Absolute 0.5  0.1 - 1.0 K/uL   Eosinophils Relative 5  0 - 5 %   Eosinophils Absolute 0.3  0.0 - 0.7 K/uL   Basophils Relative 0  0 - 1 %   Basophils Absolute 0.0  0.0 - 0.1 K/uL  COMPREHENSIVE METABOLIC PANEL      Result Value Ref Range   Sodium 141  137 - 147 mEq/L   Potassium 3.8  3.7 - 5.3 mEq/L   Chloride 106  96 - 112 mEq/L   CO2 25  19 - 32 mEq/L   Glucose, Bld 79  70 - 99 mg/dL   BUN 20   6 - 23 mg/dL   Creatinine, Ser 1.30  0.50 - 1.35 mg/dL   Calcium 8.7  8.4 - 86.5 mg/dL   Total Protein 5.9 (*) 6.0 - 8.3 g/dL   Albumin 2.8 (*) 3.5 - 5.2 g/dL   AST 20  0 - 37 U/L   ALT 19  0 - 53 U/L   Alkaline Phosphatase 63  39 - 117 U/L   Total Bilirubin 0.3  0.3 - 1.2 mg/dL   GFR calc non Af Amer 75 (*) >90 mL/min   GFR calc Af Amer 87 (*) >90 mL/min   Anion gap 10  5 - 15  GLUCOSE, CAPILLARY      Result Value Ref Range   Glucose-Capillary 126 (*) 70 - 99 mg/dL   Comment 1 Notify RN     Comment 2 Documented in Chart    GLUCOSE, CAPILLARY      Result Value Ref Range   Glucose-Capillary 76  70 - 99 mg/dL   Comment 1 Notify RN     Comment 2 Documented in Chart    I-STAT TROPOININ, ED      Result Value Ref Range   Troponin i, poc 0.01  0.00 - 0.08 ng/mL   Comment 3           I-STAT  CG4 LACTIC ACID, ED      Result Value Ref Range   Lactic Acid, Venous 1.28  0.5 - 2.2 mmol/L   Dg Chest 2 View  01/28/2014   CLINICAL DATA:  Weakness and hypotension.  EXAM: CHEST  2 VIEW  COMPARISON:  11/24/2013  FINDINGS: The cardiac silhouette is within normal limits for size. Thoracic aorta is again seen to be tortuous. Thoracic aortic calcification is noted. The patient has taken a greater inspiration than on the prior study. Right basilar opacity on the prior study has resolved. A small region of hazy opacity is now seen in the left lung on the PA image, without definite correlate on the lateral. There is a new, small right pleural effusion. No pneumothorax is seen.  IMPRESSION: 1. Question developing infectious infiltrate in the left lung base. 2. New, small right pleural effusion.   Electronically Signed   By: Sebastian Ache   On: 01/28/2014 14:30    The patient brought in from an independent living facility. Patient has past history significant for Parkinson's disease. Patient accompanied by his daughter. Patient's been having lethargy some trouble walking. He's been feeling groggy and has been  coughing.  Patient's workup consistent with pneumonia will require admission. Appropriate antibiotic started in the ED for this. Patient's chest x-ray with infectious infiltrate left lung base and a small right pleural effusion. Patient has some decreased breath sounds on the right lung bases and had rhonchi on the left. Patient is alert and follows commands.  Vanetta Mulders, MD 01/29/14 [phone]

## 2014-01-29 NOTE — Progress Notes (Signed)
Pts CBG is 76. On call MD notified. Will recheck at 0200.

## 2014-01-29 NOTE — Progress Notes (Signed)
Utilization review completed.  

## 2014-01-29 NOTE — Progress Notes (Signed)
PHARMACIST - PHYSICIAN COMMUNICATION  CONCERNING:  Home medications   RECOMMENDATION: Please reconcile his home medications and resume home eye drops and Sinemet if appropriate.  Thank you. Okey Regal, PharmD

## 2014-01-30 DIAGNOSIS — H109 Unspecified conjunctivitis: Secondary | ICD-10-CM

## 2014-01-30 MED ORDER — POLYMYXIN B-TRIMETHOPRIM 10000-0.1 UNIT/ML-% OP SOLN: 1.0000 [drp] | OPHTHALMIC | Status: AC

## 2014-01-30 MED ORDER — DORZOLAMIDE HCL-TIMOLOL MAL 2-0.5 % OP SOLN
1.0000 [drp] | Freq: Two times a day (BID) | OPHTHALMIC | Status: DC
  Administered 2014-01-30: 1 [drp] via OPHTHALMIC
  Filled 2014-01-30: qty 10

## 2014-01-30 MED ORDER — LATANOPROST 0.005 % OP SOLN
1.0000 [drp] | Freq: Every day | OPHTHALMIC | Status: DC
  Filled 2014-01-30: qty 2.5

## 2014-01-30 MED ORDER — LEVOFLOXACIN 500 MG PO TABS: 500.0000 mg | ORAL_TABLET | Freq: Every day | ORAL | Status: AC

## 2014-01-30 NOTE — Progress Notes (Signed)
Physical Therapy Treatment Patient Details Name: Alexander Holder MRN: [id number] DOB: 1921-10-30 Today's Date: 01/30/2014    History of Present Illness Pt admitted with CAP.  Pt with hx of kidney cyst and mass and Parkinson's. Pt lives at [facility].    PT Comments    Patient progressing well with mobility. Per daughter (present in room), pt slowly returning to baseline functionally and cognitively. Requires hands on assist during gait due to festination and different gait speeds putting pt at high risk for falls. Less cueing for initiation of tasks today and more conversational. Pt able to perform ADLs with some assist but takes increased time to perform all mobility/tasks. Pt continues to have poor safety awareness and requires hands on assist for all mobility to prevent falls. Will continue to follow and progress as tolerated.   Follow Up Recommendations  Home health PT;Supervision/Assistance - 24 hour     Equipment Recommendations  None recommended by PT    Recommendations for Other Services       Precautions / Restrictions Precautions Precautions: Fall Restrictions Weight Bearing Restrictions: No    Mobility  Bed Mobility Overal bed mobility: Needs Assistance Bed Mobility: Supine to Sit     Supine to sit: Supervision     General bed mobility comments: Use of bed rail and HOB elevated. No physical assist required. VC's for technique. Increased time due to impaired initiation.  Transfers Overall transfer level: Needs assistance Equipment used: Rolling walker (2 wheeled) Transfers: Sit to/from [employer] Sit to Stand: Min guard Stand pivot transfers: Min assist       General transfer comment: Pt used proper technique to stand from EOB with RW x3.  Ambulated to bathroom and required Min A and cues for proper positioning prior to descent on toilet. Use of grab bar to assist with standing. Required assist initiating safe descent  as pt tends to try to sit too early.  Ambulation/Gait Ambulation/Gait assistance: Min assist Ambulation Distance (Feet): 200 Feet Assistive device: Rolling walker (2 wheeled) Gait Pattern/deviations: Step-through pattern;Shuffle;Festinating;Trunk flexed;Narrow base of support;Decreased stride length Gait velocity: Differentiating gait speeds.   General Gait Details: Short shuffling steps noted with different gait speeds throughout ambulation. Min A required for safety/balance when gait speed increases due to forward momentum and festination. VC for RW management and to decrease speed. Stool incontinence upon ambulating to bathroom. Cleaned up patient and changed gown.   Stairs            Wheelchair Mobility    Modified Rankin (Stroke Patients Only)       Balance Overall balance assessment: Needs assistance   Sitting balance-Leahy Scale: Good Sitting balance - Comments: Able to reach down outside BoS and donn socks/shoes - increased time however no physical assist required.    Standing balance support: During functional activity Standing balance-Leahy Scale: Poor Standing balance comment: Requires BUE support during static and dynamic standing due to balance deficits. Able to stand for short periods without UE support however requires UE support during gait and transfers.                    Cognition Arousal/Alertness: Awake/alert Behavior During Therapy: Flat affect Overall Cognitive Status: History of cognitive impairments - at baseline                      Exercises      General Comments        Pertinent Vitals/Pain Pain Assessment:  No/denies pain    Home Living                      Prior Function            PT Goals (current goals can now be found in the care plan section) Progress towards PT goals: Progressing toward goals    Frequency       PT Plan Current plan remains appropriate    Co-evaluation             End  of Session Equipment Utilized During Treatment: Gait belt Activity Tolerance: Patient tolerated treatment well Patient left: in chair;with call bell/phone within reach;with family/visitor present     Time: 0920-0958 PT Time Calculation (min): 38 min  Charges:  $Gait Training: 23-37 mins $Therapeutic Activity: 8-22 mins                    G CodesAlvie Holder A 02/21/14, 11:04 AM Alexander Holder, PT, DPT [phone]

## 2014-01-30 NOTE — Progress Notes (Signed)
Pt stable and received d/c instructions. He is being escorted out and driven home by family member. Deno Lunger D 2:44 PM 01/30/2014

## 2014-01-30 NOTE — Care Management Note (Signed)
    Page 1 of 2   01/30/2014     2:22:08 PM CARE MANAGEMENT NOTE 01/30/2014  Patient:  Alexander Holder,Alexander Holder   Account Number:  [id number]  Date Initiated:  01/30/2014  Documentation initiated by:  Letha Cape  Subjective/Objective Assessment:   dx  pna  admit- from [facility].  Active with [facility] for RN and PT.     Action/Plan:   pt eval- hhpt   Anticipated DC Date:  01/30/2014   Anticipated DC Plan:  HOME W HOME HEALTH SERVICES      DC Planning Services  CM consult      [facility] Choice  HOME HEALTH   Choice offered to / List presented to:  C-4 Adult Children        HH arranged  HH-1 RN  HH-2 PT  HH-3 OT      [facility] agency  [facility]   Status of service:  Completed, signed off Medicare Important Message given?  YES (If response is "NO", the following Medicare IM given date fields will be blank) Date Medicare IM given:  01/30/2014 Medicare IM given by:  Letha Cape Date Additional Medicare IM given:   Additional Medicare IM given by:    Discharge Disposition:  HOME W HOME HEALTH SERVICES  Per UR Regulation:  Reviewed for med. necessity/level of care/duration of stay  If discussed at Long Length of Stay Meetings, dates discussed:    Comments:  01/30/14 1419 Letha Cape RN,BSN [phone] patient is from [facility].  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  NCM called [facility] outpt physical therapy , they stated that they did work with patient but before he came to the hospital he was with a Home Care Agency.  NCM informed daughter of this information,  she states they will continue with [facility], referral made to [facility], [facility] notified.  Soc will begin 24-48 hrs post dc.

## 2014-01-30 NOTE — Clinical Social Work Note (Signed)
CSW met with patient's daughter as CSW received consult stating that the patient is from [place]. Patient is from [place] where he receives extra in home services (aides etc.). Daughter states that the plan will be for the patient to return to [facility] unless a higher level of care is recommended. CSW notes that PT and OT have recommended HHPT for patient. CSW has updated RNCM on situation. CSW signing off at this time.   Liz Beach MSW, [place], [place], [phone]

## 2014-01-30 NOTE — Discharge Summary (Signed)
Name: Alexander Holder MRN: [id number] DOB: [birth year] 80 y.o. PCP: Florentina Jenny, MD  Date of Admission: 01/28/2014  2:03 PM Date of Discharge: 01/30/2014 Attending Physician: Earl Lagos, MD  Discharge Diagnosis:  Principal Problem:   CAP (community acquired pneumonia) Active Problems:   Pneumonia   Nonspecific abnormal electrocardiogram (ECG) (EKG)   Renal mass, right   Parkinsonism  Discharge Medications:   Medication List    STOP taking these medications       predniSONE 50 MG tablet  Commonly known as:  DELTASONE      TAKE these medications       aspirin EC 81 MG tablet  Take 81 mg by mouth daily.     bimatoprost 0.01 % Soln  Commonly known as:  LUMIGAN  Place 1 drop into the right eye at bedtime.     carbidopa-levodopa 25-250 MG per tablet  Commonly known as:  SINEMET IR  Take 1 tablet by mouth 4 (four) times daily. Must be given 30 minutes before each meal and one at 8 pm     donepezil 5 MG tablet  Commonly known as:  ARICEPT  Take 5 mg by mouth at bedtime.     dorzolamide-timolol 22.3-6.8 MG/ML ophthalmic solution  Commonly known as:  COSOPT  Place 1 drop into both eyes every 12 (twelve) hours.     Fish Oil 1200 MG Caps  Take 1,200 mg by mouth daily.     folic acid 800 MCG tablet  Commonly known as:  FOLVITE  Take 800 mcg by mouth daily.     furosemide 20 MG tablet  Commonly known as:  LASIX  Take 20 mg by mouth daily as needed for fluid.     guaiFENesin-dextromethorphan 100-10 MG/5ML syrup  Commonly known as:  ROBITUSSIN DM  Take 10 mLs by mouth every 6 (six) hours as needed for cough.     ibuprofen 200 MG tablet  Commonly known as:  ADVIL,MOTRIN  Take 200 mg by mouth every 6 (six) hours as needed for mild pain.     levofloxacin 500 MG tablet  Commonly known as:  LEVAQUIN  Take 1 tablet (500 mg total) by mouth daily.     MULTIVITAMIN PO  Take 1 tablet by mouth daily.     niacin 500 MG tablet  Take 500 mg by mouth daily.     trimethoprim-polymyxin b ophthalmic solution  Commonly known as:  POLYTRIM  Place 1 drop into the left eye every 4 (four) hours.     Vitamin D2 2000 UNITS Tabs  Take 2,000 Units by mouth daily.        Disposition and follow-up:   Alexander Holder was discharged from [facility] in Stable condition.  At the hospital follow up visit please address:  1.  Any respiratory symptoms (shortness of breath, cough, etc) or fever  2.  Labs / imaging needed at time of follow-up: none  3.  Pending labs/ test needing follow-up: Final blood cultures  Follow-up Appointments: Follow-up Information   Follow up with POLITE,LATRICE, FNP In 1 week. (Clinic will arrange and call)    Specialty:  Family Medicine   Contact information:   -       Follow up with [facility]. [facility], HHPT, HHOT)    Specialty:  Home Health Services   Contact information:   [address] [place] [address] [phone]       Discharge Instructions: Discharge Instructions   Diet - low sodium  heart healthy    Complete by:  As directed      Increase activity slowly    Complete by:  As directed            Procedures Performed:  Dg Chest 2 View  01/28/2014   CLINICAL DATA:  Weakness and hypotension.  EXAM: CHEST  2 VIEW  COMPARISON:  11/24/2013  FINDINGS: The cardiac silhouette is within normal limits for size. Thoracic aorta is again seen to be tortuous. Thoracic aortic calcification is noted. The patient has taken a greater inspiration than on the prior study. Right basilar opacity on the prior study has resolved. A small region of hazy opacity is now seen in the left lung on the PA image, without definite correlate on the lateral. There is a new, small right pleural effusion. No pneumothorax is seen.  IMPRESSION: 1. Question developing infectious infiltrate in the left lung base. 2. New, small right pleural effusion.   Electronically Signed   By: Sebastian Ache   On: 01/28/2014 14:30     Ct Head Wo Contrast  01/29/2014   CLINICAL DATA:  Altered mental status.  EXAM: CT HEAD WITHOUT CONTRAST  TECHNIQUE: Contiguous axial images were obtained from the base of the skull through the vertex without intravenous contrast.  COMPARISON:  None.  FINDINGS: There is no evidence of acute intracranial abnormality including infarct, hemorrhage, mass lesion, mass effect, midline shift or abnormal extra-axial fluid collection. No hydrocephalus or pneumocephalus. The calvarium is intact. Mild atrophy and chronic microvascular ischemic change noted.  IMPRESSION: No acute abnormality.   Electronically Signed   By: Drusilla Kanner M.D.   On: 01/29/2014 10:50   Admission HPI: Alexander Holder is a 80yo man w/ PMHx of MI (at 80 yo) and Parkinson's disease, and a resident at [facility] who was brought to the ED by EMS after being noted to be somnolent by his caretaker. Patient is not a good historian. Patient reports that he feels weak and just "does not feel well." He notes trouble sleeping, an 11 lb weight loss in the last 2 months despite a good appetite, achiness, and dysuria. He denies fever, chills, night sweats, chest pain, SOB, cough, wheezing, abdominal pain, diarrhea, and hematuria. Daughter was at the bedside and reports the patient seemed confused today and "slow moving" compared to his normal self.   In the ED, patient had a U/A that shows a moderate amt of Hbg, a large amt of leukocytes, and few bacteria. CXR shows an infiltrate in the left lung base concerning for pneumonia. WBC count on admission was 6.6. Lactic acid normal at 1.28. Troponin negative x 1.   Patient had CT Abd/Pelvis on 07/09/13 that showed multiple cystic lesions throughout both kidneys as well as a 2.9 cm soft tissue mass in the lower pole of the right kidney. Patient has hx of hematuria and mass was suspicious for underlying neoplasm. There are no notes showing further work up of the mass.    Hospital Course by problem  list: Principal Problem:   CAP (community acquired pneumonia) Active Problems:   Pneumonia   Nonspecific abnormal electrocardiogram (ECG) (EKG)   Renal mass, right   Parkinsonism   #Community Acquired Pneumonia: Alexander Holder presented with generalized weakness and confusion. He was found to have left lower lobe pneumonia on CXR. He has been afebrile with WBC of 6.1-6.6. Blood cultures have no growth to date but final results should be confirmed. While this is technically CAP, he was treated  as though he has healthcare-associated pneumonia as he lives in an assisted living facility and his co morbidities. He was originally on azithromycin and ceftriaxone but will be discharged with 8 days of levofloxacin 500 mg for 10 days total antibiotics. Per his family member, he appears much better today than he has in weeks.  #Altered Mental Status-resolved: Alexander Holder was unarousable morning of 9/10 and was only responsive to sternal rub. There was a concern for possible intracranial bleed. Daughter states patient is prone to falls but she does not know the last time he had a fall. CT Head was ordered STAT and was negative for any intracranial bleed. AMS unlikely due to metabolic abnormality since no electrolyte abnormalities present and WBC count has been normal at 6. No anion gap present. Blood sugar was slightly low that morning at 68, but patient was given D50 injection. TSH normal, ABG noncontributory, UDS negative. Etiologies could be pneumonia infection and UTI vs. hypercarbia (CO2 25, normal on CMP) vs.  vs. Worsening of Parkinson's (?). It resolved the morning of 9/11 and family member thought this is the best he has looked.  #EKG Changes: On admission, EKG showed sinus bradycardia with 1st degree AV block, T wave inversions in I, avL, and V2 and ST elevations in III and avF, no previous to compare. Patient did not complain of chest pain, palpitations, SOB yesterday. Patient was bradycardic in the 50s  during his stay. Troponins negative were negative x 3 and he was on cardiac monitoring.  #Parkinson's Disease: Patient takes Aricept 5 mg daily and sinemet IR 25-250 mg QID at home. Also has history of difficulty swallowing 2/2 Parkinson's. He was placed on a dysphasia diet following swallow study. He continued his aricept the entire hospitalziation while sinemet was resumed on 9/10 once reoriented.  #Right Renal Mass: Pt found to have right renal mass on CT Abd/Pelvis on 07/09/13. CT showed multiple cystic lesions throughout both kidneys and a 2.9 cm soft tissue mass in lower pole of right kidney suspicious for neoplasm. Patient apparently had history of hematuria at the time. No further work up for renal mass found in records. Patient notes dysuria for 1 year and "discolored" urine. Per family member, this is followed by Dr Isabel Caprice (?) of urology who is monitoring it and they will follow-up with him.  #Conjunctivitis: L eye has been injected nad itchy for 10 days with intermittent discharge. He was sent home with prescription for trimethoprim-polymixin 1 drop q4h in left eye and to follow-up with PCP.  Discharge Vitals:   BP 131/56  Pulse 51  Temp(Src) 97.6 F (36.4 C) (Oral)  Resp 18  Ht 6' (1.829 m)  Wt 134 lb 8 oz (61.009 kg)  BMI 18.24 kg/m2  SpO2 99%  Discharge Labs:  Basic Metabolic Panel:  Recent Labs Lab 01/28/14 1336 01/28/14 1900 01/29/14 0443  NA 138  --  141  K 4.3  --  3.8  CL 102  --  106  CO2 25  --  25  GLUCOSE 95  --  79  BUN 26*  --  20  CREATININE 0.95  --  0.85  CALCIUM 9.3  --  8.7  MG  --  1.9  --   PHOS  --  2.7  --    Liver Function Tests:  Recent Labs Lab 01/28/14 1336 01/29/14 0443  AST 24 20  ALT 8 19  ALKPHOS 74 63  BILITOT 0.3 0.3  PROT 6.7 5.9*  ALBUMIN 3.1*  2.8*   No results found for this basename: LIPASE, AMYLASE,  in the last 168 hours No results found for this basename: AMMONIA,  in the last 168 hours CBC:  Recent Labs Lab  01/28/14 1336 01/29/14 0443  WBC 6.6 6.1  NEUTROABS  --  3.7  HGB 13.1 12.7*  HCT 38.6* 37.0*  MCV 92.6 92.3  PLT 211 182   Cardiac Enzymes:  Recent Labs Lab 01/28/14 1900 01/28/14 2231 01/29/14 0443  TROPONINI <0.30 <0.30 <0.30   BNP: No results found for this basename: PROBNP,  in the last 168 hours D-Dimer: No results found for this basename: DDIMER,  in the last 168 hours CBG:  Recent Labs Lab 01/28/14 2026 01/29/14 0015 01/29/14 1011 01/29/14 1121  GLUCAP 126* 76 68* 124*   Hemoglobin A1C: No results found for this basename: HGBA1C,  in the last 168 hours Fasting Lipid Panel: No results found for this basename: CHOL, HDL, LDLCALC, TRIG, CHOLHDL, LDLDIRECT,  in the last 168 hours Thyroid Function Tests:  Recent Labs Lab 01/29/14 1625  TSH 2.010   Coagulation:  Recent Labs Lab 01/28/14 1900  LABPROT 14.6  INR 1.14   Anemia Panel: No results found for this basename: VITAMINB12, FOLATE, FERRITIN, TIBC, IRON, RETICCTPCT,  in the last 168 hours Urine Drug Screen: Drugs of Abuse     Component Value Date/Time   LABOPIA NONE DETECTED 01/29/2014 1731   COCAINSCRNUR NONE DETECTED 01/29/2014 1731   LABBENZ NONE DETECTED 01/29/2014 1731   AMPHETMU NONE DETECTED 01/29/2014 1731   THCU NONE DETECTED 01/29/2014 1731   LABBARB NONE DETECTED 01/29/2014 1731    Alcohol Level: No results found for this basename: ETH,  in the last 168 hours Urinalysis:  Recent Labs Lab 01/28/14 1458  COLORURINE YELLOW  LABSPEC 1.021  PHURINE 6.0  GLUCOSEU NEGATIVE  HGBUR MODERATE*  BILIRUBINUR NEGATIVE  KETONESUR NEGATIVE  PROTEINUR NEGATIVE  UROBILINOGEN 0.2  NITRITE NEGATIVE  LEUKOCYTESUR LARGE*   Misc. Labs: 9/10 ABG pH7.43, pCO2 37, pO2 73, bicarb 24.5 9/9 strep and legionella antigen negative   Results for orders placed during the hospital encounter of 01/28/14 (from the past 24 hour(s))  TSH     Status: None   Collection Time    01/29/14  4:25 PM       Result Value Ref Range   TSH 2.010  0.350 - 4.500 uIU/mL  URINE RAPID DRUG SCREEN (HOSP PERFORMED)     Status: None   Collection Time    01/29/14  5:31 PM      Result Value Ref Range   Opiates NONE DETECTED  NONE DETECTED   Cocaine NONE DETECTED  NONE DETECTED   Benzodiazepines NONE DETECTED  NONE DETECTED   Amphetamines NONE DETECTED  NONE DETECTED   Tetrahydrocannabinol NONE DETECTED  NONE DETECTED   Barbiturates NONE DETECTED  NONE DETECTED   Recent Results (from the past 240 hour(s))  CULTURE, BLOOD (ROUTINE X 2)     Status: None   Collection Time    01/28/14  6:50 PM      Result Value Ref Range Status   Specimen Description BLOOD RIGHT HAND   Final   Special Requests BOTTLES DRAWN AEROBIC ONLY 10CC   Final   Culture  Setup Time     Final   Value: 01/28/2014 22:41     Performed at [facility]   Culture     Final   Value:        BLOOD CULTURE RECEIVED NO  GROWTH TO DATE CULTURE WILL BE HELD FOR 5 DAYS BEFORE ISSUING A FINAL NEGATIVE REPORT     Performed at [facility]   Report Status PENDING   Incomplete  CULTURE, BLOOD (ROUTINE X 2)     Status: None   Collection Time    01/28/14  7:00 PM      Result Value Ref Range Status   Specimen Description BLOOD RIGHT ARM   Final   Special Requests BOTTLES DRAWN AEROBIC ONLY 10CC   Final   Culture  Setup Time     Final   Value: 01/28/2014 22:40     Performed at [facility]   Culture     Final   Value:        BLOOD CULTURE RECEIVED NO GROWTH TO DATE CULTURE WILL BE HELD FOR 5 DAYS BEFORE ISSUING A FINAL NEGATIVE REPORT     Performed at [facility]   Report Status PENDING   Incomplete   Discharge Physical Exam: General: patient laying in bed, NAD  HEENT: [place], small wound on right side of head, conjunctivitis present L eye without discharge CV: RRR, normal S1/S2, no m/g/r  Pulm: CTA bilaterally  Ext: left hand pill-rolling tremor, multiple bruises present on upper and lower extremities  Neuro:  alert and oriented to person and place, slow to answer questions, CNII-XII intact   Signed: Lorenda Hatchet, MD 01/30/2014, 2:21 PM    Services Ordered on Discharge: home health PT/OT Equipment Ordered on Discharge: none

## 2014-01-30 NOTE — Progress Notes (Signed)
Subjective: Alexander Holder. Patient only complaint is he got a bad night of sleep. Otherwise he feels fine with no fevers, chills, night sweats, SOB, cough or other complaint.   Objective: Vital signs in last 24 hours: Filed Vitals:   01/29/14 0959 01/29/14 1449 01/29/14 2103 01/30/14 0459  BP: 149/72 90/48 119/60 156/97  Pulse: 52 61 57 54  Temp: 98.2 F (36.8 C) 98.3 F (36.8 C) 97.3 F (36.3 C) 97.6 F (36.4 C)  TempSrc: Axillary Oral Oral Oral  Resp: Height:      Weight:      SpO2: 97% 98% 99% 98%   Weight change:   Intake/Output Summary (Last 24 hours) at 01/30/14 1149 Last data filed at 01/30/14 0459  Gross per 24 hour  Intake    120 ml  Output    576 ml  Net   -456 ml   Physical Exam: General: patient laying in bed, NAD HEENT: [place], small wound on right side of head, conjunctivitis present CV: RRR, normal S1/S2, no m/g/r Pulm: CTA bilaterally Ext: left hand pill-rolling tremor, multiple bruises present on upper and lower extremities Neuro: alert and oriented to person and place, slow to answer questions, CNII-XII intact  Lab Results: Basic Metabolic Panel:  Recent Labs Lab 01/28/14 1336 01/28/14 1900 01/29/14 0443  NA 138  --  141  K 4.3  --  3.8  CL 102  --  106  CO2 25  --  25  GLUCOSE 95  --  79  BUN 26*  --  20  CREATININE 0.95  --  0.85  CALCIUM 9.3  --  8.7  MG  --  1.9  --   PHOS  --  2.7  --    Liver Function Tests:  Recent Labs Lab 01/28/14 1336 01/29/14 0443  AST 24 20  ALT 8 19  ALKPHOS 74 63  BILITOT 0.3 0.3  PROT 6.7 5.9*  ALBUMIN 3.1* 2.8*   No results found for this basename: LIPASE, AMYLASE,  in the last 168 hours No results found for this basename: AMMONIA,  in the last 168 hours CBC:  Recent Labs Lab 01/28/14 1336 01/29/14 0443  WBC 6.6 6.1  NEUTROABS  --  3.7  HGB 13.1 12.7*  HCT 38.6* 37.0*  MCV 92.6 92.3  PLT 211 182   Cardiac Enzymes:  Recent Labs Lab 01/28/14 1900 01/28/14 2231 01/29/14 0443    TROPONINI <0.30 <0.30 <0.30   BNP: No results found for this basename: PROBNP,  in the last 168 hours D-Dimer: No results found for this basename: DDIMER,  in the last 168 hours CBG:  Recent Labs Lab 01/28/14 2026 01/29/14 0015 01/29/14 1011 01/29/14 1121  GLUCAP 126* 76 68* 124*   Hemoglobin A1C: No results found for this basename: HGBA1C,  in the last 168 hours Fasting Lipid Panel: No results found for this basename: CHOL, HDL, LDLCALC, TRIG, CHOLHDL, LDLDIRECT,  in the last 168 hours Thyroid Function Tests:  Recent Labs Lab 01/29/14 1625  TSH 2.010   Coagulation:  Recent Labs Lab 01/28/14 1900  LABPROT 14.6  INR 1.14   Anemia Panel: No results found for this basename: VITAMINB12, FOLATE, FERRITIN, TIBC, IRON, RETICCTPCT,  in the last 168 hours Urine Drug Screen: Drugs of Abuse     Component Value Date/Time   LABOPIA NONE DETECTED 01/29/2014 1731    Alcohol Level: No results found for this basename: ETH,  in the last 168 hours Urinalysis:  Recent Labs Lab 01/28/14 1458  COLORURINE YELLOW  LABSPEC 1.021  PHURINE 6.0  GLUCOSEU NEGATIVE  HGBUR MODERATE*  BILIRUBINUR NEGATIVE  KETONESUR NEGATIVE  PROTEINUR NEGATIVE  UROBILINOGEN 0.2  NITRITE NEGATIVE  LEUKOCYTESUR LARGE*    Micro Results: Recent Results (from the past 240 hour(s))  CULTURE, BLOOD (ROUTINE X 2)     Status: None   Collection Time    01/28/14  6:50 PM      Result Value Ref Range Status   Specimen Description BLOOD RIGHT HAND   Final   Special Requests BOTTLES DRAWN AEROBIC ONLY 10CC   Final   Culture  Setup Time     Final   Value: 01/28/2014 22:41     Performed at [facility]   Culture     Final   Value:        BLOOD CULTURE RECEIVED NO GROWTH TO DATE CULTURE WILL BE HELD FOR 5 DAYS BEFORE ISSUING A FINAL NEGATIVE REPORT     Performed at [facility]   Report Status PENDING   Incomplete  CULTURE, BLOOD (ROUTINE X 2)     Status: None   Collection Time     01/28/14  7:00 PM      Result Value Ref Range Status   Specimen Description BLOOD RIGHT ARM   Final   Special Requests BOTTLES DRAWN AEROBIC ONLY 10CC   Final   Culture  Setup Time     Final   Value: 01/28/2014 22:40     Performed at [facility]   Culture     Final   Value:        BLOOD CULTURE RECEIVED NO GROWTH TO DATE CULTURE WILL BE HELD FOR 5 DAYS BEFORE ISSUING A FINAL NEGATIVE REPORT     Performed at [facility]   Report Status PENDING   Incomplete   Studies/Results: Dg Chest 2 View  01/28/2014   CLINICAL DATA:  Weakness and hypotension.  EXAM: CHEST  2 VIEW  COMPARISON:  11/24/2013  FINDINGS: The cardiac silhouette is within normal limits for size. Thoracic aorta is again seen to be tortuous. Thoracic aortic calcification is noted. The patient has taken a greater inspiration than on the prior study. Right basilar opacity on the prior study has resolved. A small region of hazy opacity is now seen in the left lung on the PA image, without definite correlate on the lateral. There is a new, small right pleural effusion. No pneumothorax is seen.  IMPRESSION: 1. Question developing infectious infiltrate in the left lung base. 2. New, small right pleural effusion.   Electronically Signed   By: Sebastian Ache   On: 01/28/2014 14:30   Ct Head Wo Contrast  01/29/2014   CLINICAL DATA:  Altered mental status.  EXAM: CT HEAD WITHOUT CONTRAST  TECHNIQUE: Contiguous axial images were obtained from the base of the skull through the vertex without intravenous contrast.  COMPARISON:  None.  FINDINGS: There is no evidence of acute intracranial abnormality including infarct, hemorrhage, mass lesion, mass effect, midline shift or abnormal extra-axial fluid collection. No hydrocephalus or pneumocephalus. The calvarium is intact. Mild atrophy and chronic microvascular ischemic change noted.  IMPRESSION: No acute abnormality.   Electronically Signed   By: Drusilla Kanner M.D.   On: 01/29/2014 10:50     Medications: I have reviewed the patient's current medications. Scheduled Meds: . antiseptic oral rinse  7 mL Mouth Rinse BID  . azithromycin  500 mg Intravenous Q24H  .  carbidopa-levodopa  1 tablet Oral 4 times per day  . cefTRIAXone (ROCEPHIN)  IV  1 g Intravenous Q24H  . donepezil  5 mg Oral QHS  . dorzolamide-timolol  1 drop Both Eyes BID  . enoxaparin (LOVENOX) injection  40 mg Subcutaneous Q24H  . latanoprost  1 drop Right Eye QHS   Continuous Infusions:   PRN Meds:. Assessment/Plan: Mr. Bernie Covey is a 80yo man w/ PMHx of MI (at 80 yo) and Parkinson's disease, and a resident at [facility] who was brought to the ED for confusion and generalized weakness found to have a left lower lobe pneumonia.   1. Community Acquired Pneumonia: Pt presented with generalized weakness and confusion. Found to have left lower lobe pneumonia on CXR. Patient afebrile overnight, BP elevated up to 150s systolic, bradycardic in 50s. Has been on azithromycin and ceftriaxone  - d/c Azithromycin 500 mg PO Q24H  - d/c Ceftriaxone 1g IV Q24H  - start levofloxacin 500 mg x 8 days for 10 d total abx - Cardiac monitoring  - Blood cultures x 2 --> no growth - Sputum culture  - Strep pneumo antigen --> negative - Legionella antigen --> pending - CBC and bmet in AM   2. Altered Mental Status-resolved: Patient unarousable yesterday, only responsive to sternal rub. Resolved this morning and family member thinks this is the best he has looked in weeks. Pt continues to be bradycardic and systolic BP in 150-160s.   3. EKG Changes: On admission, EKG showed sinus bradycardia with 1st degree AV block, T wave inversions in I, avL, and V2 and ST elevations in III and avF, no previous to compare. Patient did not complain of chest pain, palpitations, SOB yesterday. Patient still bradycardic in 50s this AM.  Troponins negative x 3.   - Continue cardiac monitoring  4. Parkinson's Disease: Patient takes Aricept 5 mg daily  at home. Also has history of difficulty swallowing 2/2 Parkinson's (?).  - Continue Aricept, sinemet - dysphagia diet  5. Right Renal Mass: Pt found to have right renal mass on CT Abd/Pelvis on 07/09/13. CT showed multiple cystic lesions throughout both kidneys and a 2.9 cm soft tissue mass in lower pole of right kidney suspicious for neoplasm. Patient apparently had history of hematuria at the time. No further work up for renal mass found in records. Patient notes dysuria for 1 year and "discolored" urine.  - patient to discuss with PCP  Diet: NPO  DVT/PE PPx: Heparin SQ  Dispo: Disposition is deferred at this time, awaiting improvement of current medical problems. Anticipated discharge in approximately today   The patient does have a current PCP Florentina Jenny, MD) and does need an [facility] hospital follow-up appointment after discharge.  The patient does not have transportation limitations that hinder transportation to clinic appointments.  .Services Needed at time of discharge: Y = Yes, Blank = No PT: HHPT  OT: HHOT  RN:   Equipment:   Other:     LOS: 2 days   Lorenda Hatchet, MD 01/30/2014, 11:49 AM

## 2014-01-30 NOTE — Progress Notes (Signed)
MD notified about pulse of 54 this morning. Awaiting call back for any further orders

## 2014-01-30 NOTE — Discharge Instructions (Signed)
It was a pleasure to meet you and we are glad that your pneumonia is resolving. Please take the oral antibiotic for 8 more days. Please put the eye drop in your left eye every 4 fours and let your PCP know if this improves eye redness and discharge. Your primary care provider, [employer], will call you to see you in about one week. Please seek medical attention or return to the hospital if you have new or worsening shortness of breath, cough, fever, or other worrisome medical condition.

## 2014-01-30 NOTE — Discharge Summary (Signed)
INTERNAL MEDICINE ATTENDING DISCHARGE COSIGN   I evaluated the patient on the day of discharge and discussed the discharge plan with my resident team. I agree with the discharge documentation and disposition.   Merrisa Skorupski 01/30/2014, 4:11 PM

## 2014-01-30 NOTE — Progress Notes (Signed)
SLP Cancellation Note  Patient Details Name: Alexander Holder MRN: [id number] DOB: Oct 24, 1921   Cancelled treatment:       Reason Eval/Treat Not Completed: Other (comment);SLP screened, no needs identified, will sign off. Pt's d/c pending today. Spoke to dtr on the phone and reviewed history, Per dtr, pt has been tolerating regular diet and thin liquids well without noticeable signs of aspiration. Pt had MBS one year ago recommending this diet with no aspiration seen on exam. Will defer eval today, advised dtr to seek f/u with home health SLP via PCP if concerns arise.    Alexander Holder, Riley Nearing 01/30/2014, 1:53 PM

## 2014-01-30 NOTE — Progress Notes (Signed)
OT Cancellation Note  Patient Details Name: Alexander Holder MRN: [id number] DOB: 08-05-1921   Cancelled Treatment:    Reason Eval/Treat Not Completed: Other (comment). OT evaluated pt on 9/10 and determined to be at or near baseline. Pt is from ALF ([facility]) and has in home aides who assist with ambulation, ADL transfers, bathing, and dressing. Pt continues to have no acute needs at this time. Will defer to ALF at this time.   Rae Lips [phone] 01/30/2014, 1:49 PM

## 2014-01-30 NOTE — Progress Notes (Signed)
Pt seen and examined with Dr. Valentino Saxon. Please refer to resident note for details  Pt is awake today. Sitting up and eating. Denies any complaints.   Exam:  Gen: AAO*3, NAD Cardio: Bradycardic  Lungs: CTA b/l  Abd: soft, non distendeed, BS +  Ext: echymosis + over wrists, no edema   Assessment and Plan:   80 y/o male with likely PNA now with AMS   AMS:  - AMS resolved.  - ABG with no hypercarbia. CT head wnl - No further w/u for now - c/w meds for Parkinson's  PNA:  - c/w rocephin, zithromax day 2. Would change to PO levaquin to complete 10 day course  - f/u blood cx - negative till date  Renal mass:  - likely malignancy. Will need outpatient uro f/u   EKG changes:  - troponins negative. No symptoms of active cardiac disease  - Will monitor  Stable for d/c home today

## 2014-02-03 LAB — CULTURE, BLOOD (ROUTINE X 2)
CULTURE: NO GROWTH
Culture: NO GROWTH

## 2014-05-16 ENCOUNTER — Emergency Department (HOSPITAL_COMMUNITY): Payer: Medicare HMO

## 2014-05-16 ENCOUNTER — Inpatient Hospital Stay (HOSPITAL_COMMUNITY)
Admission: EM | Admit: 2014-05-16 | Discharge: 2014-05-22 | DRG: 871 | Disposition: E | Payer: Medicare HMO | Attending: Internal Medicine | Admitting: Internal Medicine

## 2014-05-16 ENCOUNTER — Encounter (HOSPITAL_COMMUNITY): Payer: Self-pay | Admitting: *Deleted

## 2014-05-16 DIAGNOSIS — E860 Dehydration: Secondary | ICD-10-CM | POA: Diagnosis present

## 2014-05-16 DIAGNOSIS — G3184 Mild cognitive impairment, so stated: Secondary | ICD-10-CM | POA: Diagnosis present

## 2014-05-16 DIAGNOSIS — Z96651 Presence of right artificial knee joint: Secondary | ICD-10-CM

## 2014-05-16 DIAGNOSIS — E861 Hypovolemia: Secondary | ICD-10-CM | POA: Diagnosis present

## 2014-05-16 DIAGNOSIS — Z515 Encounter for palliative care: Secondary | ICD-10-CM | POA: Diagnosis not present

## 2014-05-16 DIAGNOSIS — G20 Parkinson's disease: Secondary | ICD-10-CM | POA: Diagnosis present

## 2014-05-16 DIAGNOSIS — R300 Dysuria: Secondary | ICD-10-CM | POA: Diagnosis present

## 2014-05-16 DIAGNOSIS — E785 Hyperlipidemia, unspecified: Secondary | ICD-10-CM | POA: Diagnosis present

## 2014-05-16 DIAGNOSIS — A419 Sepsis, unspecified organism: Principal | ICD-10-CM | POA: Diagnosis present

## 2014-05-16 DIAGNOSIS — I252 Old myocardial infarction: Secondary | ICD-10-CM

## 2014-05-16 DIAGNOSIS — Z7982 Long term (current) use of aspirin: Secondary | ICD-10-CM

## 2014-05-16 DIAGNOSIS — G9340 Encephalopathy, unspecified: Secondary | ICD-10-CM | POA: Diagnosis present

## 2014-05-16 DIAGNOSIS — G92 Toxic encephalopathy: Secondary | ICD-10-CM | POA: Diagnosis present

## 2014-05-16 DIAGNOSIS — E8770 Fluid overload, unspecified: Secondary | ICD-10-CM | POA: Diagnosis not present

## 2014-05-16 DIAGNOSIS — R627 Adult failure to thrive: Secondary | ICD-10-CM | POA: Diagnosis present

## 2014-05-16 DIAGNOSIS — Z79899 Other long term (current) drug therapy: Secondary | ICD-10-CM | POA: Diagnosis not present

## 2014-05-16 DIAGNOSIS — N390 Urinary tract infection, site not specified: Secondary | ICD-10-CM | POA: Diagnosis present

## 2014-05-16 DIAGNOSIS — I10 Essential (primary) hypertension: Secondary | ICD-10-CM | POA: Diagnosis present

## 2014-05-16 DIAGNOSIS — R4182 Altered mental status, unspecified: Secondary | ICD-10-CM | POA: Diagnosis present

## 2014-05-16 DIAGNOSIS — I248 Other forms of acute ischemic heart disease: Secondary | ICD-10-CM | POA: Diagnosis present

## 2014-05-16 DIAGNOSIS — R7989 Other specified abnormal findings of blood chemistry: Secondary | ICD-10-CM | POA: Diagnosis present

## 2014-05-16 DIAGNOSIS — E876 Hypokalemia: Secondary | ICD-10-CM | POA: Diagnosis present

## 2014-05-16 DIAGNOSIS — Z66 Do not resuscitate: Secondary | ICD-10-CM | POA: Diagnosis present

## 2014-05-16 DIAGNOSIS — I214 Non-ST elevation (NSTEMI) myocardial infarction: Secondary | ICD-10-CM | POA: Insufficient documentation

## 2014-05-16 DIAGNOSIS — N179 Acute kidney failure, unspecified: Secondary | ICD-10-CM | POA: Diagnosis present

## 2014-05-16 DIAGNOSIS — R778 Other specified abnormalities of plasma proteins: Secondary | ICD-10-CM | POA: Diagnosis present

## 2014-05-16 DIAGNOSIS — I249 Acute ischemic heart disease, unspecified: Secondary | ICD-10-CM | POA: Insufficient documentation

## 2014-05-16 LAB — COMPREHENSIVE METABOLIC PANEL
ALBUMIN: 3.4 g/dL — AB (ref 3.5–5.2)
ALT: 7 U/L (ref 0–53)
AST: 22 U/L (ref 0–37)
Alkaline Phosphatase: 72 U/L (ref 39–117)
Anion gap: 11 (ref 5–15)
BILIRUBIN TOTAL: 0.8 mg/dL (ref 0.3–1.2)
BUN: 40 mg/dL — AB (ref 6–23)
CO2: 22 mmol/L (ref 19–32)
CREATININE: 1.56 mg/dL — AB (ref 0.50–1.35)
Calcium: 9.9 mg/dL (ref 8.4–10.5)
Chloride: 109 mEq/L (ref 96–112)
GFR calc Af Amer: 43 mL/min — ABNORMAL LOW (ref >90)
GFR, EST NON AFRICAN AMERICAN: 37 mL/min — AB (ref >90)
Glucose, Bld: 106 mg/dL — ABNORMAL HIGH (ref 70–99)
Potassium: 2.8 mmol/L — ABNORMAL LOW (ref 3.5–5.1)
Sodium: 142 mmol/L (ref 135–145)
Total Protein: 6.5 g/dL (ref 6.0–8.3)

## 2014-05-16 LAB — CBC WITH DIFFERENTIAL/PLATELET
BASOS ABS: 0 10*3/uL (ref 0.0–0.1)
BASOS PCT: 0 % (ref 0–1)
EOS PCT: 0 % (ref 0–5)
Eosinophils Absolute: 0 10*3/uL (ref 0.0–0.7)
HEMATOCRIT: 42.1 % (ref 39.0–52.0)
HEMOGLOBIN: 14 g/dL (ref 13.0–17.0)
Lymphocytes Relative: 10 % — ABNORMAL LOW (ref 12–46)
Lymphs Abs: 1.1 10*3/uL (ref 0.7–4.0)
MCH: 31 pg (ref 26.0–34.0)
MCHC: 33.3 g/dL (ref 30.0–36.0)
MCV: 93.3 fL (ref 78.0–100.0)
MONO ABS: 1 10*3/uL (ref 0.1–1.0)
Monocytes Relative: 8 % (ref 3–12)
Neutro Abs: 9.4 10*3/uL — ABNORMAL HIGH (ref 1.7–7.7)
Neutrophils Relative %: 82 % — ABNORMAL HIGH (ref 43–77)
Platelets: 220 10*3/uL (ref 150–400)
RBC: 4.51 MIL/uL (ref 4.22–5.81)
RDW: 14.2 % (ref 11.5–15.5)
WBC: 11.5 10*3/uL — ABNORMAL HIGH (ref 4.0–10.5)

## 2014-05-16 LAB — TROPONIN I
TROPONIN I: 0.25 ng/mL — AB (ref <0.031)
TROPONIN I: 0.3 ng/mL — AB (ref <0.031)
TROPONIN I: 0.25 ng/mL — AB (ref <0.031)

## 2014-05-16 LAB — URINALYSIS, ROUTINE W REFLEX MICROSCOPIC
BILIRUBIN URINE: NEGATIVE
Glucose, UA: NEGATIVE mg/dL
KETONES UR: 15 mg/dL — AB
NITRITE: POSITIVE — AB
Protein, ur: 100 mg/dL — AB
Specific Gravity, Urine: 1.024 (ref 1.005–1.030)
UROBILINOGEN UA: 0.2 mg/dL (ref 0.0–1.0)
pH: 5.5 (ref 5.0–8.0)

## 2014-05-16 LAB — URINE MICROSCOPIC-ADD ON

## 2014-05-16 MED ORDER — ASPIRIN 81 MG PO CHEW
324.0000 mg | CHEWABLE_TABLET | Freq: Once | ORAL | Status: DC
  Filled 2014-05-16: qty 4

## 2014-05-16 MED ORDER — POTASSIUM CHLORIDE CRYS ER 20 MEQ PO TBCR
40.0000 meq | EXTENDED_RELEASE_TABLET | Freq: Once | ORAL | Status: DC
  Filled 2014-05-16: qty 2

## 2014-05-16 MED ORDER — GUAIFENESIN-DM 100-10 MG/5ML PO SYRP
10.0000 mL | ORAL_SOLUTION | Freq: Four times a day (QID) | ORAL | Status: DC | PRN
  Administered 2014-05-17 – 2014-05-18 (×2): 10 mL via ORAL
  Filled 2014-05-16 (×2): qty 10

## 2014-05-16 MED ORDER — SODIUM CHLORIDE 0.9 % IV SOLN
INTRAVENOUS | Status: DC
  Administered 2014-05-16 – 2014-05-17 (×2): via INTRAVENOUS

## 2014-05-16 MED ORDER — SODIUM CHLORIDE 0.9 % IV SOLN: 250.0000 mL | INTRAVENOUS | Status: DC | PRN

## 2014-05-16 MED ORDER — SODIUM CHLORIDE 0.9 % IJ SOLN
3.0000 mL | Freq: Two times a day (BID) | INTRAMUSCULAR | Status: DC
  Administered 2014-05-16: 3 mL via INTRAVENOUS

## 2014-05-16 MED ORDER — HEPARIN BOLUS VIA INFUSION
3000.0000 [IU] | Freq: Once | INTRAVENOUS | Status: DC
  Filled 2014-05-16: qty 3000

## 2014-05-16 MED ORDER — MORPHINE SULFATE 2 MG/ML IJ SOLN: 2.0000 mg | INTRAMUSCULAR | Status: DC | PRN

## 2014-05-16 MED ORDER — ONDANSETRON HCL 4 MG PO TABS: 4.0000 mg | ORAL_TABLET | Freq: Four times a day (QID) | ORAL | Status: DC | PRN

## 2014-05-16 MED ORDER — FOLIC ACID 800 MCG PO TABS: 800.0000 ug | ORAL_TABLET | Freq: Every day | ORAL | Status: DC

## 2014-05-16 MED ORDER — DONEPEZIL HCL 5 MG PO TABS
5.0000 mg | ORAL_TABLET | Freq: Every day | ORAL | Status: DC
  Administered 2014-05-16 – 2014-05-17 (×2): 5 mg via ORAL
  Filled 2014-05-16 (×3): qty 1

## 2014-05-16 MED ORDER — HEPARIN (PORCINE) IN NACL 100-0.45 UNIT/ML-% IJ SOLN
800.0000 [IU]/h | INTRAMUSCULAR | Status: DC
  Filled 2014-05-16: qty 250

## 2014-05-16 MED ORDER — ACETAMINOPHEN 650 MG RE SUPP: 650.0000 mg | Freq: Four times a day (QID) | RECTAL | Status: DC | PRN

## 2014-05-16 MED ORDER — HEPARIN SODIUM (PORCINE) 5000 UNIT/ML IJ SOLN
5000.0000 [IU] | Freq: Three times a day (TID) | INTRAMUSCULAR | Status: DC
  Administered 2014-05-16 – 2014-05-17 (×5): 5000 [IU] via SUBCUTANEOUS
  Filled 2014-05-16 (×9): qty 1

## 2014-05-16 MED ORDER — ACETAMINOPHEN 325 MG PO TABS: 650.0000 mg | ORAL_TABLET | Freq: Four times a day (QID) | ORAL | Status: DC | PRN

## 2014-05-16 MED ORDER — CARBIDOPA-LEVODOPA 25-250 MG PO TABS
1.0000 | ORAL_TABLET | Freq: Four times a day (QID) | ORAL | Status: DC
  Administered 2014-05-16 – 2014-05-17 (×6): 1 via ORAL
  Filled 2014-05-16 (×14): qty 1

## 2014-05-16 MED ORDER — FOLIC ACID 1 MG PO TABS
1.0000 mg | ORAL_TABLET | Freq: Every day | ORAL | Status: DC
  Administered 2014-05-16 – 2014-05-17 (×2): 1 mg via ORAL
  Filled 2014-05-16 (×3): qty 1

## 2014-05-16 MED ORDER — DORZOLAMIDE HCL-TIMOLOL MAL 2-0.5 % OP SOLN
1.0000 [drp] | Freq: Two times a day (BID) | OPHTHALMIC | Status: DC
Start: 2014-05-16 — End: 2014-05-19
  Administered 2014-05-16 – 2014-05-17 (×3): 1 [drp] via OPHTHALMIC
  Filled 2014-05-16 (×2): qty 10

## 2014-05-16 MED ORDER — POTASSIUM CHLORIDE 10 MEQ/100ML IV SOLN
10.0000 meq | INTRAVENOUS | Status: AC
  Administered 2014-05-16 (×4): 10 meq via INTRAVENOUS
  Filled 2014-05-16 (×4): qty 100

## 2014-05-16 MED ORDER — ONDANSETRON HCL 4 MG/2ML IJ SOLN: 4.0000 mg | Freq: Four times a day (QID) | INTRAMUSCULAR | Status: DC | PRN

## 2014-05-16 MED ORDER — DEXTROSE 5 % IV SOLN
1.0000 g | INTRAVENOUS | Status: DC
  Administered 2014-05-16: 1 g via INTRAVENOUS
  Filled 2014-05-16 (×2): qty 10

## 2014-05-16 MED ORDER — OXYCODONE HCL 5 MG PO TABS: 5.0000 mg | ORAL_TABLET | ORAL | Status: DC | PRN

## 2014-05-16 MED ORDER — ASPIRIN EC 81 MG PO TBEC
81.0000 mg | DELAYED_RELEASE_TABLET | Freq: Every day | ORAL | Status: DC
  Administered 2014-05-16 – 2014-05-17 (×2): 81 mg via ORAL
  Filled 2014-05-16 (×3): qty 1

## 2014-05-16 MED ORDER — ALUM & MAG HYDROXIDE-SIMETH 200-200-20 MG/5ML PO SUSP: 30.0000 mL | Freq: Four times a day (QID) | ORAL | Status: DC | PRN

## 2014-05-16 MED ORDER — LATANOPROST 0.005 % OP SOLN
1.0000 [drp] | Freq: Every day | OPHTHALMIC | Status: DC
  Administered 2014-05-16 – 2014-05-17 (×2): 1 [drp] via OPHTHALMIC
  Filled 2014-05-16: qty 2.5

## 2014-05-16 MED ORDER — VITAMIN D2 50 MCG (2000 UT) PO TABS: 2000.0000 [IU] | ORAL_TABLET | Freq: Every day | ORAL | Status: DC

## 2014-05-16 MED ORDER — OMEGA-3-ACID ETHYL ESTERS 1 G PO CAPS
1.0000 g | ORAL_CAPSULE | Freq: Every day | ORAL | Status: DC
  Administered 2014-05-16 – 2014-05-17 (×2): 1 g via ORAL
  Filled 2014-05-16 (×3): qty 1

## 2014-05-16 MED ORDER — SODIUM CHLORIDE 0.9 % IJ SOLN: 3.0000 mL | INTRAMUSCULAR | Status: DC | PRN

## 2014-05-16 MED ORDER — FISH OIL 1200 MG PO CAPS: 1200.0000 mg | ORAL_CAPSULE | Freq: Every day | ORAL | Status: DC

## 2014-05-16 NOTE — ED Notes (Signed)
Pt with occasional cough.

## 2014-05-16 NOTE — ED Notes (Signed)
Pt states that he is not in pain, that he does not feel bad, that he has no complaint. States "the girl was new" referencing he had a new aid come in today that has not met or worked with him before. Ems was called for unresponsive but advise that when they arrived pt was sitting up fully dressed in a chair. The aid and patient denied any period of unresponsivness.

## 2014-05-16 NOTE — ED Provider Notes (Signed)
CSN: [id number]     Arrival date & time 05/14/2014  [id number] History   First MD Initiated Contact with Patient 05/11/[phone] [id number]     Chief Complaint  Patient presents with  . Fatigue     (Consider location/radiation/quality/duration/timing/severity/associated sxs/prior Treatment) HPI Comments: Patient brought to the ER by ambulance for evaluation of mental status changes. Patient [facility] from a [facility]. Patient does have an aide that sits with him. He apparently had a new aide come in this morning and she felt like he was not responding to him. EMS reports that they found the patient sitting in a wheelchair. His eyes were closed, but he did answer all questions appropriately, follow-up commands. Patient has no complaints. He does report that he has sleepy this morning, otherwise no problems.   Past Medical History  Diagnosis Date  . Paralysis agitans   . Unspecified glaucoma   . Orthostatic hypotension   . Other dysphagia   . Other specified cardiac dysrhythmias(427.89)   . Other and unspecified hyperlipidemia   . Edema   . Abnormality of gait   . Seborrheic dermatitis, unspecified   . Impacted cerumen   . Mild cognitive impairment, so stated   . Other family disruption     Wife in ALF with dementia  . Parkinson disease   . Hypertension   . History of blood transfusion     "related to some surgery"  . GERD (gastroesophageal reflux disease)   . Arthritis     "joints ache when I wake up" (01/28/2014)  . Anxiety   . Depression   . CAP (community acquired pneumonia) 01/28/2014   Past Surgical History  Procedure Laterality Date  . Tonsillectomy and adenoidectomy    . Inguinal hernia repair Right   . Total knee arthroplasty Right   . Joint replacement    . Cataract extraction w/ intraocular lens  implant, bilateral Bilateral    Family History  Problem Relation Age of Onset  . Cancer Brother   . Diabetes Mother    History  Substance Use Topics  . Smoking  status: Never Smoker   . Smokeless tobacco: Never Used  . Alcohol Use: No    Review of Systems  Constitutional: Positive for fatigue.  All other systems reviewed and are negative.     Allergies  Review of patient's allergies indicates no known allergies.  Home Medications   Prior to Admission medications   Medication Sig Start Date End Date Taking? Authorizing Provider  aspirin EC 81 MG tablet Take 81 mg by mouth daily.   Yes Historical Provider, MD  bimatoprost (LUMIGAN) 0.01 % SOLN Place 1 drop into the right eye at bedtime.   Yes Historical Provider, MD  carbidopa-levodopa (SINEMET IR) 25-250 MG per tablet Take 1 tablet by mouth 4 (four) times daily. Must be given 30 minutes before each meal and one at 8 pm   Yes Historical Provider, MD  donepezil (ARICEPT) 5 MG tablet Take 5 mg by mouth at bedtime.   Yes Historical Provider, MD  dorzolamide-timolol (COSOPT) 22.3-6.8 MG/ML ophthalmic solution Place 1 drop into both eyes every 12 (twelve) hours.  02/05/13  Yes Historical Provider, MD  Ergocalciferol (VITAMIN D2) 2000 UNITS TABS Take 2,000 Units by mouth daily.   Yes Historical Provider, MD  folic acid (FOLVITE) 800 MCG tablet Take 800 mcg by mouth daily.   Yes Historical Provider, MD  furosemide (LASIX) 20 MG tablet Take 20 mg by mouth daily as needed  for fluid.   Yes Historical Provider, MD  guaiFENesin-dextromethorphan (ROBITUSSIN DM) 100-10 MG/5ML syrup Take 10 mLs by mouth every 6 (six) hours as needed for cough.   Yes Historical Provider, MD  Multiple Vitamins-Minerals (MULTIVITAMIN PO) Take 1 tablet by mouth daily.   Yes Historical Provider, MD  niacin 500 MG tablet Take 250 mg by mouth daily.    Yes Historical Provider, MD  Omega-3 Fatty Acids (FISH OIL) 1200 MG CAPS Take 1,200 mg by mouth daily.   Yes Historical Provider, MD  levofloxacin (LEVAQUIN) 500 MG tablet Take 1 tablet (500 mg total) by mouth daily. Patient not taking: Reported on 24-May-2014 01/30/14   Lorenda Hatchet,  MD  trimethoprim-polymyxin b [facility] [address]) ophthalmic solution Place 1 drop into the left eye every 4 (four) hours. Patient not taking: Reported on 2014/05/24 01/30/14   Lorenda Hatchet, MD   BP 121/64 mmHg  Pulse 76  Temp(Src) 98.2 F (36.8 C) (Oral)  Resp 20  Ht 5\' 10"  (1.778 m)  Wt 153 lb (69.4 kg)  BMI 21.95 kg/m2  SpO2 94% Physical Exam  Constitutional: He is oriented to person, place, and time. He appears well-developed and well-nourished. No distress.  HENT:  Head: Normocephalic and atraumatic.  Right Ear: Hearing normal.  Left Ear: Hearing normal.  Nose: Nose normal.  Mouth/Throat: Oropharynx is clear and moist and mucous membranes are normal.  Eyes: Conjunctivae and EOM are normal. Pupils are equal, round, and reactive to light.  Neck: Normal range of motion. Neck supple.  Cardiovascular: Regular rhythm, S1 normal and S2 normal.  Exam reveals no gallop and no friction rub.   No murmur heard. Pulmonary/Chest: Effort normal and breath sounds normal. No respiratory distress. He exhibits no tenderness.  Abdominal: Soft. Normal appearance and bowel sounds are normal. There is no hepatosplenomegaly. There is no tenderness. There is no rebound, no guarding, no tenderness at McBurney's point and negative Murphy's sign. No hernia.  Musculoskeletal: Normal range of motion.  Neurological: He is alert and oriented to person, place, and time. He has normal strength. No cranial nerve deficit or sensory deficit. Coordination normal. GCS eye subscore is 4. GCS verbal subscore is 5. GCS motor subscore is 6.  Skin: Skin is warm, dry and intact. No rash noted. No cyanosis.  Psychiatric: He has a normal mood and affect. His speech is normal and behavior is normal. Thought content normal.  Nursing note and vitals reviewed.   ED Course  Procedures (including critical care time) Labs Review Labs Reviewed  CBC WITH DIFFERENTIAL - Abnormal; Notable for the following:    WBC 11.5 (*)    Neutrophils  Relative % 82 (*)    Neutro Abs 9.4 (*)    Lymphocytes Relative 10 (*)    All other components within normal limits  COMPREHENSIVE METABOLIC PANEL - Abnormal; Notable for the following:    Potassium 2.8 (*)    Glucose, Bld 106 (*)    BUN 40 (*)    Creatinine, Ser 1.56 (*)    Albumin 3.4 (*)    GFR calc non Af Amer 37 (*)    GFR calc Af Amer 43 (*)    All other components within normal limits  TROPONIN I - Abnormal; Notable for the following:    Troponin I 0.25 (*)    All other components within normal limits  URINALYSIS, ROUTINE W REFLEX MICROSCOPIC    Imaging Review Dg Chest 2 View  05-24-2014   CLINICAL DATA:  Altered mental status,  intermittent cough  EXAM: CHEST  2 VIEW  COMPARISON:  01/28/2014  FINDINGS: Cardiomediastinal silhouette is unremarkable. No acute infiltrate or pleural effusion. No pulmonary edema. Mild degenerative changes lower thoracic spine. Moderate gaseous distension of the colon.  IMPRESSION: No active cardiopulmonary disease. Moderate gaseous distention visualized upper colon.   Electronically Signed   By: Natasha MeadLiviu  Pop M.D.   On: 02-03-2014 11:01     EKG Interpretation   Date/Time:  Saturday May 16 2014 09:24:05 EST Ventricular Rate:  78 PR Interval:  73 QRS Duration: 93 QT Interval:  413 QTC Calculation: 470 R Axis:   9 Text Interpretation:  Sinus rhythm Short PR interval Repol abnrm suggests  ischemia, anterolateral Confirmed by POLLINA  MD, CHRISTOPHER ([id number]) on  02-03-2014 10:08:39 AM      MDM   Final diagnoses:  Mental status change  Hypokalemia  AKI (acute kidney injury)  ACS (acute coronary syndrome)    Patient brought to the emergency department for evaluation of altered mental status. Patient reportedly had an unresponsive episode earlier. EMS was called and found him sitting up in a wheelchair, responding to voice, but not opening his eyes. Upon arrival to the ER, patient is in similar condition, but has improved in after  multiple evaluations, is sitting up in bed and talking with his daughter.  Workup for mental status changes was initiated at arrival. This included an EKG that did look suspicious for inferior and lateral ischemia. A troponin was therefore ordered and it does show elevation at 0.25. Further discussion with the patient's daughter reveals that he had a myocardial infarction in 1965. It's not clear what kind of treatment he, at that time, does not have stented has not had bypass. He has not routinely see a cardiologist. Patient has not had any chest pain today, however.  Case discussed with Dr. Allyson SabalBerry, cardiology. He recommended admission to medicine because the patient has other medical problems. He did recommend initiating heparin and will consult on the patient.  Labs also showed hypokalemia. In addition to this, dehydration is suspected as his BUN and creatinine has doubled from his last value.  CRITICAL CARE Performed by: Gilda CreasePOLLINA, CHRISTOPHER J.   Total critical care time: 30min  Critical care time was exclusive of separately billable procedures and treating other patients.  Critical care was necessary to treat or prevent imminent or life-threatening deterioration.  Critical care was time spent personally by me on the following activities: development of treatment plan with patient and/or surrogate as well as nursing, discussions with consultants, evaluation of patient's response to treatment, examination of patient, obtaining history from patient or surrogate, ordering and performing treatments and interventions, ordering and review of laboratory studies, ordering and review of radiographic studies, pulse oximetry and re-evaluation of patient's condition.     Gilda Creasehristopher J. Pollina, MD November 29, 2013 1153

## 2014-05-16 NOTE — H&P (Signed)
[facility] History and Physical  Alexander Holder ZOX:[id number] DOB: May 09, 1922 DOA: 06-07-14  Referring physician:  PCP: Florentina Jenny, MD   Chief Complaint: Mental status changes  HPI: Alexander Holder is a 80 y.o. male with a past medical history of Parkinson's disease, cognitive impairment, dyslipidemia who was transferred from his facility to the emergency department for mental status changes noted by nursing staff. Patient had been in his usual state health until this morning where nursing staff felt he had decreased responsiveness and not acting himself. EMS was called for patient was found sitting in wheelchair with eyes closed however was arousable to voice. Patient's only complaint in the emergency room is that he is hungry and thirsty. He denies chest pain, shortness of breath, nausea, vomiting, abdominal pain, diarrhea. Lab work included a urinalysis that showed the presence of urinary tract infection. He was also found to be hypokalemic and in acute renal failure. Troponin came back elevated at 0.25. Patient was seen and evaluated by cardiology in the emergency room. Family members expressing desire for conservative management only.                                                                                                                                                                                                           Review of Systems:  Difficult to obtain reliable review of systems given underlying cognitive impairment and functional decline  Past Medical History  Diagnosis Date  . Paralysis agitans   . Unspecified glaucoma   . Orthostatic hypotension   . Other and unspecified hyperlipidemia   . Abnormality of gait   . Seborrheic dermatitis, unspecified   . Impacted cerumen   . Mild cognitive impairment, so stated   . Other family disruption     Wife in ALF with dementia  . Parkinson disease   . Hypertension   . History of blood transfusion    "related to some surgery"  . GERD (gastroesophageal reflux disease)   . Arthritis     "joints ache when I wake up" (01/28/2014)  . Anxiety   . Depression   . CAP (community acquired pneumonia) 01/28/2014   Past Surgical History  Procedure Laterality Date  . Tonsillectomy and adenoidectomy    . Inguinal hernia repair Right   . Total knee arthroplasty Right   . Joint replacement    . Cataract extraction w/ intraocular lens  implant, bilateral Bilateral    Social History:  reports that he has never smoked. He has never used smokeless tobacco. He reports that he does not  drink alcohol or use illicit drugs.  No Known Allergies  Family History  Problem Relation Age of Onset  . Cancer Brother   . Diabetes Mother   . CAD Mother      Prior to Admission medications   Medication Sig Start Date End Date Taking? Authorizing Provider  aspirin EC 81 MG tablet Take 81 mg by mouth daily.   Yes Historical Provider, MD  bimatoprost (LUMIGAN) 0.01 % SOLN Place 1 drop into the right eye at bedtime.   Yes Historical Provider, MD  carbidopa-levodopa (SINEMET IR) 25-250 MG per tablet Take 1 tablet by mouth 4 (four) times daily. Must be given 30 minutes before each meal and one at 8 pm   Yes Historical Provider, MD  donepezil (ARICEPT) 5 MG tablet Take 5 mg by mouth at bedtime.   Yes Historical Provider, MD  dorzolamide-timolol (COSOPT) 22.3-6.8 MG/ML ophthalmic solution Place 1 drop into both eyes every 12 (twelve) hours.  02/05/13  Yes Historical Provider, MD  Ergocalciferol (VITAMIN D2) 2000 UNITS TABS Take 2,000 Units by mouth daily.   Yes Historical Provider, MD  folic acid (FOLVITE) 800 MCG tablet Take 800 mcg by mouth daily.   Yes Historical Provider, MD  furosemide (LASIX) 20 MG tablet Take 20 mg by mouth daily as needed for fluid.   Yes Historical Provider, MD  guaiFENesin-dextromethorphan (ROBITUSSIN DM) 100-10 MG/5ML syrup Take 10 mLs by mouth every 6 (six) hours as needed for cough.   Yes  Historical Provider, MD  Multiple Vitamins-Minerals (MULTIVITAMIN PO) Take 1 tablet by mouth daily.   Yes Historical Provider, MD  niacin 500 MG tablet Take 250 mg by mouth daily.    Yes Historical Provider, MD  Omega-3 Fatty Acids (FISH OIL) 1200 MG CAPS Take 1,200 mg by mouth daily.   Yes Historical Provider, MD  levofloxacin (LEVAQUIN) 500 MG tablet Take 1 tablet (500 mg total) by mouth daily. Patient not taking: Reported on 05/01/2014 01/30/14   Lorenda HatchetAdam L Rothman, MD  trimethoprim-polymyxin b [facility](POLYTRIM) ophthalmic solution Place 1 drop into the left eye every 4 (four) hours. Patient not taking: Reported on 05/04/2014 01/30/14   Lorenda HatchetAdam L Rothman, MD   Physical Exam: Filed Vitals:   04/21/2014 1015 05/06/2014 1115 04/27/2014 1117 05/15/2014 1207  BP: 146/71 121/64 121/64 152/74  Pulse: 86 72 76 71  Temp:    99.6 F (37.6 C)  TempSrc:    Rectal  Resp: 17 15 20 16   Height:      Weight:      SpO2: 88% 91% 94% 95%    Wt Readings from Last 3 Encounters:  04/26/2014 69.4 kg (153 lb)  01/28/14 61.009 kg (134 lb 8 oz)  10/28/13 60.782 kg (134 lb)    General:  Patient is awake, able to follow commands. Elderly, in no acute distress, asking for food. Eyes: PERRL, normal lids, irises & conjunctiva ENT: grossly normal hearing, lips & tongue, dry oral mucosa Neck: no LAD, masses or thyromegaly Cardiovascular: RRR, no m/r/g. No LE edema. Telemetry: SR, no arrhythmias  Respiratory: CTA bilaterally, no w/r/r. Normal respiratory effort. Abdomen: soft, ntnd Skin: no rash or induration seen on limited exam Musculoskeletal: grossly normal tone BUE/BLE Psychiatric: Patient having flat affect, poor historian, no acute distress, able to follow commands. Neurologic: grossly non-focal.          Labs on Admission:  Basic Metabolic Panel:  Recent Labs Lab 05/09/2014 0950  NA 142  K 2.8*  CL 109  CO2 22  GLUCOSE 106*  BUN 40*  CREATININE 1.56*  CALCIUM 9.9   Liver Function Tests:  Recent Labs Lab  05/15/2014 0950  AST 22  ALT 7  ALKPHOS 72  BILITOT 0.8  PROT 6.5  ALBUMIN 3.4*   No results for input(s): LIPASE, AMYLASE in the last 168 hours. No results for input(s): AMMONIA in the last 168 hours. CBC:  Recent Labs Lab 05/18/2014 0950  WBC 11.5*  NEUTROABS 9.4*  HGB 14.0  HCT 42.1  MCV 93.3  PLT 220   Cardiac Enzymes:  Recent Labs Lab 05/17/2014 0950  TROPONINI 0.25*    BNP (last 3 results) No results for input(s): PROBNP in the last 8760 hours. CBG: No results for input(s): GLUCAP in the last 168 hours.  Radiological Exams on Admission: Dg Chest 2 View  04/30/2014   CLINICAL DATA:  Altered mental status, intermittent cough  EXAM: CHEST  2 VIEW  COMPARISON:  01/28/2014  FINDINGS: Cardiomediastinal silhouette is unremarkable. No acute infiltrate or pleural effusion. No pulmonary edema. Mild degenerative changes lower thoracic spine. Moderate gaseous distension of the colon.  IMPRESSION: No active cardiopulmonary disease. Moderate gaseous distention visualized upper colon.   Electronically Signed   By: Natasha MeadLiviu  Pop M.D.   On: 04/23/2014 11:01    EKG: Independently reviewed. EKG showing sinus rhythm with T-wave inversions present in anterolateral leads  Assessment/Plan Principal Problem:   Acute encephalopathy Active Problems:   UTI (lower urinary tract infection)   Dehydration   Elevated troponin   Hypokalemia   FTT (failure to thrive) in adult   1. Acute encephalopathy/failure to thrive. Likely secondary to multiple factors including urinary tract infection and dehydration. History also stated that he has not received his medications, including carbidopa/levodopa. Will start him on gentle IV fluid hydration with normal saline running at 75 mL/hour and emperic antibiotic therapy with Rocephin for UTI. Physical therapy consult.  2. Urinary tract infection. Urinalysis in the emergency room showing the presence of nitrates leukocyte esterase and bacteria, suspect this  is contributing to his functional decline. Follow-up on urine cultures meanwhile treat empirically with ceftriaxone 1 g IV every 24 hours 3. Hypokalemia. Will replace with IV potassium chloride. Patient dehydrated and had been on Lasix therapy at home which likely contributed to hypokalemia. 4. Acute kidney injury. Patient presenting with creatinine 1.56, was 0.85 on 01/29/2014. Likely secondary to prerenal azotemia, will discontinue diuretic therapy and start gentle IV fluid hydration. Follow-up on a.m. lab work 5. Dehydration. Patient appears dry on physical exam, furthermore is on Lasix therapy at home and has had functional decline over the last 24 hours with minimal by mouth intake contributing to dehydration. Will discontinue diuretic therapy start IV fluids with normal saline running at 75 mL/hour.  6. Elevated troponin. Showing a troponin level of 0.25. Could be secondary to demand ischemia. Cardiology was consulted, medical management. 7. Parkinson's disease. We'll continue carbidopa/levodopa 25/250 tablet every 4 hours.  8. DVT prophylaxis. Subcutaneous heparin    Code Status: Discussed CODE STATUS with his daughter present at bedside patient is a DO NOT RESUSCITATE Family Communication: Spoke to his daughter who was present at bedside Disposition Plan: Will admit patient to the inpatient service anticipate he will require greater than 2 nights hospitalization  Time spent: 70 min  Jeralyn BennettZAMORA, Albirda Shiel [facility] Pager [phone]

## 2014-05-16 NOTE — Consult Note (Signed)
Primary cardiologist: New  HPI: 80 year old male with past medical history of Parkinson's for evaluation of abnormal troponin. Patient apparently suffered a myocardial infarction in 1965 but no details available. No cardiac history since. The patient resides in assisted living. He ambulates in his apartment with a walker but uses a wheelchair to go to meals. His mental status was felt to be decreased this morning and he was brought to the emergency room. He has dysuria and a mildly productive cough. He denies dyspnea or chest pain. His troponin was checked and was mildly abnormal and cardiology asked to evaluate.   (Not in a hospital admission)  No Known Allergies   Past Medical History  Diagnosis Date  . Paralysis agitans   . Unspecified glaucoma   . Orthostatic hypotension   . Other dysphagia   . Other specified cardiac dysrhythmias(427.89)   . Other and unspecified hyperlipidemia   . Edema   . Abnormality of gait   . Seborrheic dermatitis, unspecified   . Impacted cerumen   . Mild cognitive impairment, so stated   . Other family disruption     Wife in ALF with dementia  . Parkinson disease   . Hypertension   . History of blood transfusion     "related to some surgery"  . GERD (gastroesophageal reflux disease)   . Arthritis     "joints ache when I wake up" (01/28/2014)  . Anxiety   . Depression   . CAP (community acquired pneumonia) 01/28/2014    Past Surgical History  Procedure Laterality Date  . Tonsillectomy and adenoidectomy    . Inguinal hernia repair Right   . Total knee arthroplasty Right   . Joint replacement    . Cataract extraction w/ intraocular lens  implant, bilateral Bilateral     History   Social History  . Marital Status: Married    Spouse Name: [name]    Number of Children: [name]  . Years of Education: [name]   Occupational History  . Not on file.   Social History Main Topics  . Smoking status: Never Smoker   . Smokeless tobacco: Never Used  .  Alcohol Use: No  . Drug Use: No  . Sexual Activity: No   Other Topics Concern  . Not on file   Social History Narrative    Family History  Problem Relation Age of Onset  . Cancer Brother   . Diabetes Mother     ROS:  Unsteady gait, falls, dysuria and productive cough but no fevers or chills, hemoptysis, dysphasia, odynophagia, melena, hematochezia, hematuria, rash, seizure activity, orthopnea, PND, pedal edema, claudication. Remaining systems are negative.  Physical Exam:   Blood pressure 152/74, pulse 71, temperature 99.6 F (37.6 C), temperature source Rectal, resp. rate 16, height _0  (1.778 m), weight 153 lb (69.4 kg), SpO2 95 %.  General:  Well developed/frail in NAD Skin warm/dry No peripheral clubbing Back-normal HEENT-normal/normal eyelids Neck supple/normal carotid upstroke bilaterally; no bruits; no JVD; no thyromegaly chest - CTA/ normal expansion CV - RRR/normal S1 and S2; no murmurs, rubs or gallops;  PMI nondisplaced Abdomen -NT/mildly distended, no HSM, no mass, + bowel sounds, no bruit 2+ femoral pulses, no bruits Ext-no edema, chords, 2+ DP Neuro-answers questions slowly; exam consistent with Parkinsons  ECG sinus rhythm with anterolateral T-wave inversion. T-wave inversion slightly more prominent compared to September 2015.  Results for orders placed or performed during the hospital encounter of 05/15/2014 (from the past 48 hour(s))  CBC with  Differential     Status: Abnormal   Collection Time: 05/14/2014  9:50 AM  Result Value Ref Range   WBC 11.5 (H) 4.0 - 10.5 K/uL   RBC 4.51 4.22 - 5.81 MIL/uL   Hemoglobin 14.0 13.0 - 17.0 g/dL   HCT 42.1 39.0 - 52.0 %   MCV 93.3 78.0 - 100.0 fL   MCH 31.0 26.0 - 34.0 pg   MCHC 33.3 30.0 - 36.0 g/dL   RDW 14.2 11.5 - 15.5 %   Platelets 220 150 - 400 K/uL   Neutrophils Relative % 82 (H) 43 - 77 %   Neutro Abs 9.4 (H) 1.7 - 7.7 K/uL   Lymphocytes Relative 10 (L) 12 - 46 %   Lymphs Abs 1.1 0.7 - 4.0 K/uL    Monocytes Relative 8 3 - 12 %   Monocytes Absolute 1.0 0.1 - 1.0 K/uL   Eosinophils Relative 0 0 - 5 %   Eosinophils Absolute 0.0 0.0 - 0.7 K/uL   Basophils Relative 0 0 - 1 %   Basophils Absolute 0.0 0.0 - 0.1 K/uL  Comprehensive metabolic panel     Status: Abnormal   Collection Time: 05/15/2014  9:50 AM  Result Value Ref Range   Sodium 142 135 - 145 mmol/L    Comment: Please note change in reference range.   Potassium 2.8 (L) 3.5 - 5.1 mmol/L    Comment: Please note change in reference range.   Chloride 109 96 - 112 mEq/L   CO2 22 19 - 32 mmol/L   Glucose, Bld 106 (H) 70 - 99 mg/dL   BUN 40 (H) 6 - 23 mg/dL   Creatinine, Ser 1.56 (H) 0.50 - 1.35 mg/dL   Calcium 9.9 8.4 - 10.5 mg/dL   Total Protein 6.5 6.0 - 8.3 g/dL   Albumin 3.4 (L) 3.5 - 5.2 g/dL   AST 22 0 - 37 U/L   ALT 7 0 - 53 U/L   Alkaline Phosphatase 72 39 - 117 U/L   Total Bilirubin 0.8 0.3 - 1.2 mg/dL   GFR calc non Af Amer 37 (L) >90 mL/min   GFR calc Af Amer 43 (L) >90 mL/min    Comment: (NOTE) The eGFR has been calculated using the CKD EPI equation. This calculation has not been validated in all clinical situations. eGFR's persistently <90 mL/min signify possible Chronic Kidney Disease.    Anion gap 11 5 - 15  Troponin I     Status: Abnormal   Collection Time: 05/17/2014  9:50 AM  Result Value Ref Range   Troponin I 0.25 (H) <0.031 ng/mL    Comment:        PERSISTENTLY INCREASED TROPONIN VALUES IN THE RANGE OF 0.04-0.49 ng/mL CAN BE SEEN IN:       -UNSTABLE ANGINA       -CONGESTIVE HEART FAILURE       -MYOCARDITIS       -CHEST TRAUMA       -ARRYHTHMIAS       -LATE PRESENTING MYOCARDIAL INFARCTION       -COPD   CLINICAL FOLLOW-UP RECOMMENDED. Please note change in reference range.   Urinalysis, Routine w reflex microscopic     Status: Abnormal   Collection Time: 05/15/2014 12:07 PM  Result Value Ref Range   Color, Urine AMBER (A) YELLOW    Comment: BIOCHEMICALS MAY BE AFFECTED BY COLOR   APPearance  TURBID (A) CLEAR   Specific Gravity, Urine 1.024 1.005 - 1.030   pH  5.5 5.0 - 8.0   Glucose, UA NEGATIVE NEGATIVE mg/dL   Hgb urine dipstick MODERATE (A) NEGATIVE   Bilirubin Urine NEGATIVE NEGATIVE   Ketones, ur 15 (A) NEGATIVE mg/dL   Protein, ur 100 (A) NEGATIVE mg/dL   Urobilinogen, UA 0.2 0.0 - 1.0 mg/dL   Nitrite POSITIVE (A) NEGATIVE   Leukocytes, UA LARGE (A) NEGATIVE  Urine microscopic-add on     Status: Abnormal   Collection Time: 05/20/2014 12:07 PM  Result Value Ref Range   Squamous Epithelial / LPF FEW (A) RARE   WBC, UA TOO NUMEROUS TO COUNT <3 WBC/hpf   RBC / HPF 3-6 <3 RBC/hpf   Bacteria, UA FEW (A) RARE    Dg Chest 2 View  05/08/2014   CLINICAL DATA:  Altered mental status, intermittent cough  EXAM: CHEST  2 VIEW  COMPARISON:  01/28/2014  FINDINGS: Cardiomediastinal silhouette is unremarkable. No acute infiltrate or pleural effusion. No pulmonary edema. Mild degenerative changes lower thoracic spine. Moderate gaseous distension of the colon.  IMPRESSION: No active cardiopulmonary disease. Moderate gaseous distention visualized upper colon.   Electronically Signed   By: Lahoma Crocker M.D.   On: 05/01/2014 11:01    Assessment/Plan 1 abnormal troponin-patient presents with altered mental status and urinary tract infection. His electrocardiogram was noted to have anterior lateral T-wave inversion. There is some note of this in September 2015 but it is slightly more prominent. His troponin was checked and minimally elevated. However this is in the setting of renal insufficiency. The patient is a no CODE BLUE based on his daughter's report. She also states that they want only conservative measures and no procedures.  Would treat with aspirin. I will not add a beta blocker as he has a history of orthostatic hypotension. Continue to cycle enzymes. His troponin remains only minimally elevated this would verify that this is not an acute coronary event. Regardless we will treat medically  as outlined above. 2 urinary tract infection-patient needs antibiotics and this is most likely the cause of his altered mental status. I will leave this to primary care. 3 dehydration-he should be gently hydrated. 4 acute renal failure-most likely related to dehydration. Follow renal function with hydration. 5 hypokalemia-needs to be supplemented. 6 Parkinson's-continue preadmission medications.  Kirk Ruths MD 05/21/2014, 1:36 PM

## 2014-05-16 NOTE — ED Notes (Signed)
Patient transported to X-ray 

## 2014-05-16 NOTE — Progress Notes (Addendum)
ANTICOAGULATION CONSULT NOTE - Initial Consult  Pharmacy Consult for Heparin Indication: chest pain/ACS  No Known Allergies  Patient Measurements: Height: 5\' 10"  (177.8 cm) Weight: 153 lb (69.4 kg) IBW/kg (Calculated) : 73 Heparin Dosing Weight:   Vital Signs: Temp: 99.6 F (37.6 C) (12/26 1207) Temp Source: Rectal (12/26 1207) BP: 152/74 mmHg (12/26 1207) Pulse Rate: 71 (12/26 1207)  Labs:  Recent Labs  11-27-13 0950  HGB 14.0  HCT 42.1  PLT 220  CREATININE 1.56*  TROPONINI 0.25*    Estimated Creatinine Clearance: 30.9 mL/min (by C-G formula based on Cr of 1.56).   Medical History: Past Medical History  Diagnosis Date  . Paralysis agitans   . Unspecified glaucoma   . Orthostatic hypotension   . Other dysphagia   . Other specified cardiac dysrhythmias(427.89)   . Other and unspecified hyperlipidemia   . Edema   . Abnormality of gait   . Seborrheic dermatitis, unspecified   . Impacted cerumen   . Mild cognitive impairment, so stated   . Other family disruption     Wife in ALF with dementia  . Parkinson disease   . Hypertension   . History of blood transfusion     "related to some surgery"  . GERD (gastroesophageal reflux disease)   . Arthritis     "joints ache when I wake up" (01/28/2014)  . Anxiety   . Depression   . CAP (community acquired pneumonia) 01/28/2014    Medications:  Scheduled:  . aspirin  324 mg Oral Once  . potassium chloride  40 mEq Oral Once    Assessment: 80yo male to start heparin for ACS.  No coags have been checked.  Hg and pltc are wnl; he is on no anticoagulants pta and has no hx of bleeding.  Goal of Therapy:  Heparin level 0.3-0.7 units/ml Monitor platelets by anticoagulation protocol: Yes   Plan:  Heparin 3000 units IV x 1, 800 units/hr HL in 6hr Daily HL, CBC Monitor for s/s of bleeding.  Marisue HumbleKendra Yee Gangi, PharmD Clinical Pharmacist [place] [facility]

## 2014-05-16 NOTE — ED Notes (Signed)
spoike to dr Vanessa Barbarazamora and he is coming to see patient and talk to family about plan of care

## 2014-05-17 DIAGNOSIS — N179 Acute kidney failure, unspecified: Secondary | ICD-10-CM | POA: Diagnosis present

## 2014-05-17 LAB — CBC
HEMATOCRIT: 43.3 % (ref 39.0–52.0)
Hemoglobin: 14.1 g/dL (ref 13.0–17.0)
MCH: 30.7 pg (ref 26.0–34.0)
MCHC: 32.6 g/dL (ref 30.0–36.0)
MCV: 94.3 fL (ref 78.0–100.0)
Platelets: 221 10*3/uL (ref 150–400)
RBC: 4.59 MIL/uL (ref 4.22–5.81)
RDW: 14.3 % (ref 11.5–15.5)
WBC: 16.8 10*3/uL — ABNORMAL HIGH (ref 4.0–10.5)

## 2014-05-17 LAB — BASIC METABOLIC PANEL
Anion gap: 12 (ref 5–15)
BUN: 39 mg/dL — ABNORMAL HIGH (ref 6–23)
CALCIUM: 9.6 mg/dL (ref 8.4–10.5)
CHLORIDE: 108 meq/L (ref 96–112)
CO2: 22 mmol/L (ref 19–32)
Creatinine, Ser: 1.18 mg/dL (ref 0.50–1.35)
GFR calc Af Amer: 61 mL/min — ABNORMAL LOW (ref >90)
GFR calc non Af Amer: 52 mL/min — ABNORMAL LOW (ref >90)
Glucose, Bld: 96 mg/dL (ref 70–99)
Potassium: 2.5 mmol/L — CL (ref 3.5–5.1)
SODIUM: 142 mmol/L (ref 135–145)

## 2014-05-17 LAB — CLOSTRIDIUM DIFFICILE BY PCR: Toxigenic C. Difficile by PCR: NEGATIVE

## 2014-05-17 LAB — TROPONIN I: Troponin I: 0.22 ng/mL — ABNORMAL HIGH (ref <0.031)

## 2014-05-17 LAB — MAGNESIUM: MAGNESIUM: 2 mg/dL (ref 1.5–2.5)

## 2014-05-17 MED ORDER — FUROSEMIDE 10 MG/ML IJ SOLN
20.0000 mg | Freq: Once | INTRAMUSCULAR | Status: AC
  Administered 2014-05-17: 20 mg via INTRAVENOUS
  Filled 2014-05-17: qty 2

## 2014-05-17 MED ORDER — POTASSIUM CHLORIDE 10 MEQ/100ML IV SOLN
10.0000 meq | INTRAVENOUS | Status: AC
  Administered 2014-05-17 (×4): 10 meq via INTRAVENOUS
  Filled 2014-05-17 (×3): qty 100

## 2014-05-17 MED ORDER — POTASSIUM CHLORIDE 10 MEQ/100ML IV SOLN
10.0000 meq | INTRAVENOUS | Status: AC
  Administered 2014-05-17 (×4): 10 meq via INTRAVENOUS
  Filled 2014-05-17: qty 100

## 2014-05-17 MED ORDER — DEXTROSE 5 % IV SOLN
1.0000 g | Freq: Once | INTRAVENOUS | Status: AC
  Administered 2014-05-17: 1 g via INTRAVENOUS
  Filled 2014-05-17: qty 1

## 2014-05-17 MED ORDER — SODIUM CHLORIDE 0.9 % IV BOLUS (SEPSIS)
500.0000 mL | Freq: Once | INTRAVENOUS | Status: AC
  Administered 2014-05-17: 500 mL via INTRAVENOUS

## 2014-05-17 MED ORDER — DEXTROSE 5 % IV SOLN
1.0000 g | INTRAVENOUS | Status: DC
  Filled 2014-05-17: qty 1

## 2014-05-17 MED ORDER — POTASSIUM CHLORIDE CRYS ER 20 MEQ PO TBCR
40.0000 meq | EXTENDED_RELEASE_TABLET | Freq: Four times a day (QID) | ORAL | Status: AC
  Administered 2014-05-17 (×2): 40 meq via ORAL
  Filled 2014-05-17 (×2): qty 2

## 2014-05-17 NOTE — Progress Notes (Signed)
ANTIBIOTIC CONSULT NOTE - INITIAL  Pharmacy Consult for Cefepime Indication: UTI  No Known Allergies  Patient Measurements: Height: 5\' 10"  (177.8 cm) Weight: 153 lb (69.4 kg) IBW/kg (Calculated) : 73 Adjusted Body Weight:   Vital Signs: Temp: 98 F (36.7 C) (12/27 0555) Temp Source: Axillary (12/27 0555) BP: 118/62 mmHg (12/27 0555) Pulse Rate: 70 (12/27 0555) Intake/Output from previous day: 12/26 0701 - 12/27 0700 In: 1098 [P.O.:120; I.V.:678; IV Piggyback:300] Out: 200 [Urine:200] Intake/Output from this shift:    Labs:  Recent Labs  06/02/13 0950 05/17/14 0245  WBC 11.5* 16.8*  HGB 14.0 14.1  PLT 220 221  CREATININE 1.56* 1.18   Estimated Creatinine Clearance: 40.8 mL/min (by C-G formula based on Cr of 1.18). No results for input(s): VANCOTROUGH, VANCOPEAK, VANCORANDOM, GENTTROUGH, GENTPEAK, GENTRANDOM, TOBRATROUGH, TOBRAPEAK, TOBRARND, AMIKACINPEAK, AMIKACINTROU, AMIKACIN in the last 72 hours.   Microbiology: No results found for this or any previous visit (from the past 720 hour(s)).  Medical History: Past Medical History  Diagnosis Date  . Paralysis agitans   . Unspecified glaucoma   . Orthostatic hypotension   . Other and unspecified hyperlipidemia   . Abnormality of gait   . Seborrheic dermatitis, unspecified   . Impacted cerumen   . Mild cognitive impairment, so stated   . Other family disruption     Wife in ALF with dementia  . Parkinson disease   . Hypertension   . History of blood transfusion     "related to some surgery"  . GERD (gastroesophageal reflux disease)   . Arthritis     "joints ache when I wake up" (01/28/2014)  . Anxiety   . Depression   . CAP (community acquired pneumonia) 01/28/2014    Medications:  Scheduled:  . aspirin EC  81 mg Oral Daily  . carbidopa-levodopa  1 tablet Oral QID  . ceFEPime (MAXIPIME) IV  1 g Intravenous Once  . donepezil  5 mg Oral QHS  . dorzolamide-timolol  1 drop Both Eyes Q12H  . folic acid  1  mg Oral Daily  . heparin  5,000 Units Subcutaneous 3 times per day  . latanoprost  1 drop Right Eye QHS  . omega-3 acid ethyl esters  1 g Oral Daily  . potassium chloride  10 mEq Intravenous Q1 Hr x 4  . potassium chloride  40 mEq Oral 4 times per day  . sodium chloride  500 mL Intravenous Once  . sodium chloride  3 mL Intravenous Q12H  . sodium chloride  3 mL Intravenous Q12H   Assessment: 80yo male with presumed UTI and hx Pseudomonas UTI with WBC continuing to increase on Rocephin, to change to Cefepime.  Blood cx are pending.  Cr 1.18- improved with hydration.  First dose has been ordered by MD  Goal of Therapy:  Resolution of infection  Plan:  Cont Cefepime 1g IV 24 F/U cx  Marisue HumbleKendra Dimitris Shanahan, PharmD Clinical Pharmacist [place] [facility]

## 2014-05-17 NOTE — Progress Notes (Signed)
PT Cancellation Note  Patient Details Name: Alexander PaneRussell Holder MRN: [id number] DOB: December 20, 1921   Cancelled Treatment:    Reason Eval/Treat Not Completed: Medical issues which prohibited therapy  Per RN, pt is lethargic and not feeling well today. Flexiseal in place and c. Diff culture pending due to pt with diarrhea. RN advised holding PT eval until tomorrow.     Ilda FoilGarrow, [name] 05/17/2014, 11:36 AM

## 2014-05-17 NOTE — Progress Notes (Signed)
    Subjective:  Does not respond to questions this AM   Objective:  Filed Vitals:   06/22/2013 1454 06/22/2013 1600 06/22/2013 2057 05/17/14 0555  BP:  155/91 112/60 118/62  Pulse:  72 68 70  Temp: 99.9 F (37.7 C) 98.6 F (37 C) 98.2 F (36.8 C) 98 F (36.7 C)  TempSrc: Rectal Axillary Axillary Axillary  Resp:  17 15 16   Height:      Weight:      SpO2:  98% 98% 97%    Intake/Output from previous day:  Intake/Output Summary (Last 24 hours) at 05/17/14 [id number] Last data filed at 05/17/14 [id number]  Gross per 24 hour  Intake   1098 ml  Output    200 ml  Net    898 ml    Physical Exam: Physical exam: Well-developed frail in no acute distress.  Skin is warm and dry.  HEENT is normal.  Neck is supple.  Chest is clear to auscultation with normal expansion.  Cardiovascular exam is regular rate and rhythm. 2/6 systolic murmur apex Abdominal exam mildly distended Extremities show no edema. neuro not responding this AM    Lab Results: Basic Metabolic Panel:  Recent Labs  54/01/8101/05/2013 0950 05/17/14 0245 05/17/14 0610  NA 142 142  --   K 2.8* 2.5*  --   CL 109 108  --   CO2 22 22  --   GLUCOSE 106* 96  --   BUN 40* 39*  --   CREATININE 1.56* 1.18  --   CALCIUM 9.9 9.6  --   MG  --   --  2.0   CBC:  Recent Labs  06/22/2013 0950 05/17/14 0245  WBC 11.5* 16.8*  NEUTROABS 9.4*  --   HGB 14.0 14.1  HCT 42.1 43.3  MCV 93.3 94.3  PLT 220 221   Cardiac Enzymes:  Recent Labs  06/22/2013 1432 06/22/2013 2111 05/17/14 0245  TROPONINI 0.30* 0.25* 0.22*     Assessment/Plan:  1 abnormal troponin-patient presented with altered mental status and urinary tract infection. His electrocardiogram was noted to have anterior lateral T-wave inversion. There is some note of this in September 2015 but it is slightly more prominent. His troponin was checked and minimally elevated. However this is in the setting of renal insufficiency. FU enzymes unchanged with no clear trend. I do not  think this represents an acute coronary syndrome. The patient is a no CODE BLUE based on his daughter's report. She also states that they want only conservative measures and no procedures. Would treat with aspirin. I will not add a beta blocker as he has a history of orthostatic hypotension.  2 urinary tract infection-continue antibiotics and this is most likely the cause of his altered mental status. I will leave this to primary care. 3 dehydration-continue gentle hydration 4 acute renal failure-most likely related to dehydration; improving with hydration 5 hypokalemia-supplement 6 Parkinson's-continue preadmission medications.  Olga MillersBrian Crenshaw 05/17/2014, 9:28 AM

## 2014-05-17 NOTE — Progress Notes (Addendum)
Follow up note  I was notified by RN that patient had increased work of breathing. I evaluated him at bedside, has mild crackles at bases with audible wheezes. I think this could be due to edema from IV fluids. IV fluids discontinued and will administer 20 mg of IV lasix. Urine output remains poor. Foley catheter in placed today.

## 2014-05-17 NOTE — Progress Notes (Signed)
[facility] PROGRESS NOTE  Alexander Holder AVW:[id number] DOB: 25-Jan-1922 DOA: 05/01/2014 PCP: Florentina JennyRIPP, HENRY, MD  Assessment/Plan: 1. Acute encephalopathy -Likely secondary to toxic encephalopathy in setting of infectious process. Initial lab work showing presence of urinary tract infection. Initially was given ceftriaxone. Review of previous cultures showing Pseudomonas growing from urine on 11/24/2013. -Ceftriaxone was discontinued and started on cefepime IV with pharmacy consultation. -Meanwhile will continue IV fluid resuscitation  2.  Urinary tract infection. -As mentioned above given previous urine cultures from July 2015 growing Pseudomonas, ceftriaxone was discontinued as he was started on IV cefepime -Urine cultures drawn on 05/13/2014 pending at the time of this dictation. -Follow-up on blood cultures.  3.  Hypokalemia. -Despite receiving 40 mEq of IV potassium chloride yesterday his potassium remains low at 2.5 on a.m. lab work -Have ordered an additional 40 mg of IV potassium chloride along with K Dort 40 mEq by mouth every 6 hours x 2 -Will repeat lab work in a.m.  4.  Acute kidney injury -Creatinine trending down from 1.56 on admission to 1.18 with demonstration of IV fluids -Likely secondary to hypovolemia  5. Elevated troponins -Doubt secondary to acute coronary syndrome, could be related to acute kidney injury or perhaps demand ischemia -Cardiology following  6.  Parkinson's disease -Continue carbidopa/levodopa   Code Status: DO NOT RESUSCITATE Family Communication: I spoke with his daughter who was present at bedside Disposition Plan: Continue IV fluids, IV antibiotic therapy, follow-up on cultures, anticipate discharge to skilled nursing facility when medically stable   Consultants:  Cardiology   Antibiotics:  Ceftriaxone (started on 05/20/2014 ended on 05/17/2014)  Cefepime (started on 05/17/2014)  HPI/Subjective: Patient is a pleasant  80 year old gentleman with a past history of Parkinson's disease, cognitive impairment, admitted to the medicine service on 05/01/2014 presenting with mental status changes. Initial labs revealed the presence of acute kidney injury as his creatinine trended up to 1.56 from 0.85 on previous lab work. Initial troponin mildly elevated at 0.25 although patient did not complain of chest pain. EKG revealed T-wave inversions. He was found to have urinary tract infection. In all likelihood infectious process along with dehydration precipitated functional decline. He was initially started on IV ceftriaxone along with IV fluid resuscitation. Cardiology was consulted. Goals of care discussed with family as they did not wish for patient to undergo invasive/burdensome procedures. Agree with medical management. Troponins remained stable overnight as this likely represented demand ischemia rather than acute coronary syndrome. On the following day patient remains encephalopathic. Looking back at previous cultures it appears urine cultures had grown Pseudomonas for which ceftriaxone was discontinued and he was started on cefepime IV.  Objective: Filed Vitals:   05/17/14 0555  BP: 118/62  Pulse: 70  Temp: 98 F (36.7 C)  Resp: 16    Intake/Output Summary (Last 24 hours) at 05/17/14 1257 Last data filed at 05/17/14 [id number]  Gross per 24 hour  Intake   1098 ml  Output    200 ml  Net    898 ml   Filed Weights   05/03/2014 [id number]  Weight: 69.4 kg (153 lb)    Exam:   General:  Patient is lethargic, difficult to arouse  Cardiovascular: Regular rate and rhythm normal S1-S2 no murmurs rubs or gallops  Respiratory: Normal respiratory effort, has a few bibasilar crackles, no rhonchi or rales  Abdomen: Soft nontender nondistended  Musculoskeletal: Trace edema to lower extremities bilaterally  Data Reviewed: Basic Metabolic Panel:  Recent Labs Lab 05/11/2014 0950 05/17/14 0245  05/17/14 0610  NA 142 142  --    K 2.8* 2.5*  --   CL 109 108  --   CO2 22 22  --   GLUCOSE 106* 96  --   BUN 40* 39*  --   CREATININE 1.56* 1.18  --   CALCIUM 9.9 9.6  --   MG  --   --  2.0   Liver Function Tests:  Recent Labs Lab July 23, 2013 0950  AST 22  ALT 7  ALKPHOS 72  BILITOT 0.8  PROT 6.5  ALBUMIN 3.4*   No results for input(s): LIPASE, AMYLASE in the last 168 hours. No results for input(s): AMMONIA in the last 168 hours. CBC:  Recent Labs Lab July 23, 2013 0950 05/17/14 0245  WBC 11.5* 16.8*  NEUTROABS 9.4*  --   HGB 14.0 14.1  HCT 42.1 43.3  MCV 93.3 94.3  PLT 220 221   Cardiac Enzymes:  Recent Labs Lab July 23, 2013 0950 July 23, 2013 1432 July 23, 2013 2111 05/17/14 0245  TROPONINI 0.25* 0.30* 0.25* 0.22*   BNP (last 3 results) No results for input(s): PROBNP in the last 8760 hours. CBG: No results for input(s): GLUCAP in the last 168 hours.  Recent Results (from the past 240 hour(s))  Clostridium Difficile by PCR     Status: None   Collection Time: 05/17/14  3:58 AM  Result Value Ref Range Status   C difficile by pcr NEGATIVE NEGATIVE Final     Studies: Dg Chest 2 View  2014-04-05   CLINICAL DATA:  Altered mental status, intermittent cough  EXAM: CHEST  2 VIEW  COMPARISON:  01/28/2014  FINDINGS: Cardiomediastinal silhouette is unremarkable. No acute infiltrate or pleural effusion. No pulmonary edema. Mild degenerative changes lower thoracic spine. Moderate gaseous distension of the colon.  IMPRESSION: No active cardiopulmonary disease. Moderate gaseous distention visualized upper colon.   Electronically Signed   By: Natasha MeadLiviu  Pop M.D.   On: 2014-04-05 11:01    Scheduled Meds: . aspirin EC  81 mg Oral Daily  . carbidopa-levodopa  1 tablet Oral QID  . [START ON 05/18/2014] ceFEPime (MAXIPIME) IV  1 g Intravenous Q24H  . donepezil  5 mg Oral QHS  . dorzolamide-timolol  1 drop Both Eyes Q12H  . folic acid  1 mg Oral Daily  . heparin  5,000 Units Subcutaneous 3 times per day  . latanoprost  1  drop Right Eye QHS  . omega-3 acid ethyl esters  1 g Oral Daily  . potassium chloride  10 mEq Intravenous Q1 Hr x 4  . sodium chloride  3 mL Intravenous Q12H  . sodium chloride  3 mL Intravenous Q12H   Continuous Infusions: . sodium chloride 100 mL/hr at 05/17/14 [id number]    Principal Problem:   Acute encephalopathy Active Problems:   UTI (lower urinary tract infection)   Dehydration   Elevated troponin   Hypokalemia   FTT (failure to thrive) in adult    Time spent: 35 min    Jeralyn BennettZAMORA, Shadee Rathod  [facility] Pager [phone]. If 7PM-7AM, please contact night-coverage at [email or web address], password [email or web address] 05/17/2014, 12:57 PM  LOS: 1 day

## 2014-05-18 DIAGNOSIS — Z7189 Other specified counseling: Secondary | ICD-10-CM

## 2014-05-18 LAB — BASIC METABOLIC PANEL
ANION GAP: 12 (ref 5–15)
BUN: 50 mg/dL — ABNORMAL HIGH (ref 6–23)
CO2: 15 mmol/L — AB (ref 19–32)
Calcium: 9.2 mg/dL (ref 8.4–10.5)
Chloride: 116 mEq/L — ABNORMAL HIGH (ref 96–112)
Creatinine, Ser: 2.7 mg/dL — ABNORMAL HIGH (ref 0.50–1.35)
GFR calc Af Amer: 22 mL/min — ABNORMAL LOW (ref >90)
GFR, EST NON AFRICAN AMERICAN: 19 mL/min — AB (ref >90)
Glucose, Bld: 83 mg/dL (ref 70–99)
Potassium: 3.7 mmol/L (ref 3.5–5.1)
Sodium: 143 mmol/L (ref 135–145)

## 2014-05-18 LAB — CBC
HCT: 44.7 % (ref 39.0–52.0)
Hemoglobin: 14.3 g/dL (ref 13.0–17.0)
MCH: 31.8 pg (ref 26.0–34.0)
MCHC: 32 g/dL (ref 30.0–36.0)
MCV: 99.3 fL (ref 78.0–100.0)
PLATELETS: 210 10*3/uL (ref 150–400)
RBC: 4.5 MIL/uL (ref 4.22–5.81)
RDW: 14.9 % (ref 11.5–15.5)
WBC: 18.3 10*3/uL — AB (ref 4.0–10.5)

## 2014-05-18 MED ORDER — SCOPOLAMINE 1 MG/3DAYS TD PT72
1.0000 | MEDICATED_PATCH | TRANSDERMAL | Status: DC
  Filled 2014-05-18: qty 1

## 2014-05-18 MED ORDER — MORPHINE SULFATE 2 MG/ML IJ SOLN
1.0000 mg | Freq: Once | INTRAMUSCULAR | Status: DC
  Filled 2014-05-18: qty 1

## 2014-05-18 MED ORDER — SODIUM CHLORIDE 0.9 % IV SOLN: 250.0000 mL | INTRAVENOUS | Status: DC | PRN

## 2014-05-18 MED ORDER — SODIUM CHLORIDE 0.9 % IJ SOLN: 3.0000 mL | Freq: Two times a day (BID) | INTRAMUSCULAR | Status: DC

## 2014-05-18 MED ORDER — SODIUM CHLORIDE 0.9 % IJ SOLN: 3.0000 mL | INTRAMUSCULAR | Status: DC | PRN

## 2014-05-18 MED ORDER — POLYVINYL ALCOHOL 1.4 % OP SOLN
1.0000 [drp] | Freq: Four times a day (QID) | OPHTHALMIC | Status: DC | PRN
  Filled 2014-05-18: qty 15

## 2014-05-18 NOTE — Progress Notes (Signed)
Overnight event noted, Dr. Vanessa BarbaraZamora aware the patient has worsening respiratory function, worsening renal failure. Patient is awake, however unresponsive. No urine output since last night. BP dropping into 50-70s. Long conversation with daughter. Per daughter, they has been informed of extremely poor prognosis and patient has been transitioned to full comfort care. Daughter states patient has very clear advanced directive and does not wish for aggressive intervention. Family are coming in to see patient.   Physical exam:  Heart: RRR, no murmur Lung: bilateral rhonchi on anterior exam  General: no apparent pain, awake, but not responding any question   Prognosis: very poor. Per daughter, transition to comfort care.   Will discuss with Dr. Royann Shiversroitoru. Cardiology will sign off.   Ramond DialSigned, Summers Buendia PA Pager: [phone]

## 2014-05-18 NOTE — Progress Notes (Signed)
[facility] PROGRESS NOTE  Alexander Holder [id number] DOB: 1921/05/26 DOA: 05/11/2014 PCP: Florentina JennyRIPP, HENRY, MD  Assessment/Plan:  1. Goals of care -Over the last 24 hours patient having a steep decline, coming unresponsive on this mornings evaluation. Overnight his breathing appeared labored for which I was concerned for the possibility of fluid overload as he had received IV fluid over the course of the day. He was administered IV Lasix. Despite this he has had minimal urine output. Goals of care were discussed with his daughters who were present at bedside. They expressed wishes to transition care to full comfort. They state that he clearly would not have wanted further aggressive/burdensome interventions, they wished to focus on keeping him comfortable and on quality of life. Comfort care orders placed in chart. IV antimicrobial therapy discontinued   2. Acute encephalopathy  2.  Urinary tract infection.  3.  Hypokalemia.  4.  Acute kidney injury  5. Elevated troponins  6.  Parkinson's disease    Code Status: DO NOT RESUSCITATE Family Communication: I spoke with his daughters who were present at bedside Disposition Plan: Transition to comfort care  Consultants:  Cardiology   Antibiotics:  Ceftriaxone (started on 05/11/2014 ended on 05/17/2014)  Cefepime (started on 05/17/2014 discontinued on 05/18/2014)  HPI/Subjective: Patient is a pleasant 80 year old gentleman with a past history of Parkinson's disease, cognitive impairment, admitted to the medicine service on 05/07/2014 presenting with mental status changes. Initial labs revealed the presence of acute kidney injury as his creatinine trended up to 1.56 from 0.85 on previous lab work. Initial troponin mildly elevated at 0.25 although patient did not complain of chest pain. EKG revealed T-wave inversions. He was found to have urinary tract infection. In all likelihood infectious process along with dehydration  precipitated functional decline. He was initially started on IV ceftriaxone along with IV fluid resuscitation. Cardiology was consulted. Goals of care discussed with family as they did not wish for patient to undergo invasive/burdensome procedures. Agree with medical management. Troponins remained stable overnight as this likely represented demand ischemia rather than acute coronary syndrome. On the following day patient remains encephalopathic. Looking back at previous cultures it appears urine cultures had grown Pseudomonas for which ceftriaxone was discontinued and he was started on cefepime IV.  Objective: Filed Vitals:   05/18/14 1517  BP: 122/81  Pulse: 80  Temp: 97.8 F (36.6 C)  Resp: 21    Intake/Output Summary (Last 24 hours) at 05/18/14 1709 Last data filed at 05/18/14 0701  Gross per 24 hour  Intake      0 ml  Output   1290 ml  Net  -1290 ml   Filed Weights   05/20/2014 0927  Weight: 69.4 kg (153 lb)    Exam:   General:  Patient is unresponsive  Cardiovascular: Regular rate and rhythm normal S1-S2 no murmurs rubs or gallops  Respiratory: Coarse respiratory sounds, few by lateral crackles, no wheezing rhonchi  Abdomen: Soft nontender nondistended  Musculoskeletal: Trace edema to lower extremities bilaterally  Data Reviewed: Basic Metabolic Panel:  Recent Labs Lab 05/17/2014 0950 05/17/14 0245 05/17/14 0610 05/18/14 0740  NA 142 142  --  143  K 2.8* 2.5*  --  3.7  CL 109 108  --  116*  CO2 22 22  --  15*  GLUCOSE 106* 96  --  83  BUN 40* 39*  --  50*  CREATININE 1.56* 1.18  --  2.70*  CALCIUM 9.9 9.6  --  9.2  MG  --   --  2.0  --    Liver Function Tests:  Recent Labs Lab 05/03/2014 0950  AST 22  ALT 7  ALKPHOS 72  BILITOT 0.8  PROT 6.5  ALBUMIN 3.4*   No results for input(s): LIPASE, AMYLASE in the last 168 hours. No results for input(s): AMMONIA in the last 168 hours. CBC:  Recent Labs Lab 05/02/2014 0950 05/17/14 0245 05/18/14 0740   WBC 11.5* 16.8* 18.3*  NEUTROABS 9.4*  --   --   HGB 14.0 14.1 14.3  HCT 42.1 43.3 44.7  MCV 93.3 94.3 99.3  PLT 220 221 210   Cardiac Enzymes:  Recent Labs Lab 05/14/2014 0950 05/14/2014 1432 05/18/2014 2111 05/17/14 0245  TROPONINI 0.25* 0.30* 0.25* 0.22*   BNP (last 3 results) No results for input(s): PROBNP in the last 8760 hours. CBG: No results for input(s): GLUCAP in the last 168 hours.  Recent Results (from the past 240 hour(s))  Clostridium Difficile by PCR     Status: None   Collection Time: 05/17/14  3:58 AM  Result Value Ref Range Status   C difficile by pcr NEGATIVE NEGATIVE Final  Culture, blood (routine x 2)     Status: None (Preliminary result)   Collection Time: 05/17/14  8:50 AM  Result Value Ref Range Status   Specimen Description BLOOD RIGHT FOREARM  Final   Special Requests BOTTLES DRAWN AEROBIC ONLY 6 CC  Final   Culture   Final           BLOOD CULTURE RECEIVED NO GROWTH TO DATE CULTURE WILL BE HELD FOR 5 DAYS BEFORE ISSUING A FINAL NEGATIVE REPORT [facility]    Report Status PENDING  Incomplete  Culture, blood (routine x 2)     Status: None (Preliminary result)   Collection Time: 05/17/14  9:00 AM  Result Value Ref Range Status   Specimen Description BLOOD RIGHT HAND  Final   Special Requests BOTTLES DRAWN AEROBIC ONLY 6 CC  Final   Culture   Final           BLOOD CULTURE RECEIVED NO GROWTH TO DATE CULTURE WILL BE HELD FOR 5 DAYS BEFORE ISSUING A FINAL NEGATIVE REPORT [facility]formed at [facility]    Report Status PENDING  Incomplete     Studies: No results found.  Scheduled Meds: . carbidopa-levodopa  1 tablet Oral QID  . dorzolamide-timolol  1 drop Both Eyes Q12H  . latanoprost  1 drop Right Eye QHS  .  morphine injection  1 mg Intravenous Once  . sodium chloride  3 mL Intravenous Q12H  . sodium chloride  3 mL Intravenous Q12H   Continuous Infusions:    Principal Problem:   Acute encephalopathy Active  Problems:   UTI (lower urinary tract infection)   Dehydration   Elevated troponin   Hypokalemia   FTT (failure to thrive) in adult   AKI (acute kidney injury)    Time spent: 35 min, 25 min spent in family counseling/goals of care    Jeralyn BennettZAMORA, Jusitn Salsgiver  [facility] Pager [phone]. If 7PM-7AM, please contact night-coverage at [email or web address], password [email or web address] 05/18/2014, 5:09 PM  LOS: 2 days

## 2014-05-18 NOTE — Progress Notes (Signed)
PT Cancellation Note  Patient Details Name: Alexander Holder MRN: [id number] DOB: Apr 12, 1922   Cancelled Treatment:    Reason Eval/Treat Not Completed: Medical issues which prohibited therapy.  Per nursing note, patient is unresponsive this am with SBP in 50's.  Will hold PT today and monitor for any improvement in status.   [name], [name] 05/18/2014, 10:45 AM Durenda HurtSusan H. Renaldo Fiddleravis, PT, [facility] Acute [facility] Pager [phone]

## 2014-05-18 NOTE — Progress Notes (Signed)
Md made aware , patient is unresponsive.SBP 50's,Md to come in to discuss status with daughter.

## 2014-05-18 NOTE — Progress Notes (Signed)
Morphine 1 mg wasted witnessed by Leota Jacobsenara Cross RN

## 2014-05-19 DIAGNOSIS — A419 Sepsis, unspecified organism: Secondary | ICD-10-CM | POA: Diagnosis present

## 2014-05-22 NOTE — Progress Notes (Signed)
At 1140 family called staff and noted patient stop breathing, np pulse , no BP noted. Another licensed nurse verified death. MD made aware of the death. Pronounced death.Emotional support given to family. [place] [employer] referral done with referral no. [id number] and noted not suitable for organ donation.

## 2014-05-22 NOTE — Discharge Summary (Addendum)
Death Summary  Alexander Holder Seres [id number] DOB: Jan 21, 1922 DOA: 05/07/2014  PCP: Florentina JennyRIPP, HENRY, MD PCP/Office notified:   Admit date: 05/08/2014 Date of Death: 05/04/2014  Final Diagnoses:  Principal Problem:   Sepsis Active Problems:   Acute encephalopathy   UTI (lower urinary tract infection)   Dehydration   Elevated troponin   Hypokalemia   FTT (failure to thrive) in adult   AKI (acute kidney injury)  Sepsis Present on Admission   History of present illness:  Alexander Holder Schwalm is a 81 y.o. male with a past medical history of Parkinson's disease, cognitive impairment, dyslipidemia who was transferred from his facility to the emergency department for mental status changes noted by nursing staff. Patient had been in his usual state health until this morning where nursing staff felt he had decreased responsiveness and not acting himself. EMS was called for patient was found sitting in wheelchair with eyes closed however was arousable to voice. Patient's only complaint in the emergency room is that he is hungry and thirsty. He denies chest pain, shortness of breath, nausea, vomiting, abdominal pain, diarrhea. Lab work included a urinalysis that showed the presence of urinary tract infection. He was also found to be hypokalemic and in acute renal failure. Troponin came back elevated at 0.25. Patient was seen and evaluated by cardiology in the emergency room. Family members expressing desire for conservative management only.  Hospital Course:  Patient is a pleasant 81 year old gentleman with a past history of Parkinson's disease, cognitive impairment, admitted to the medicine service on 05/01/2014 presenting with mental status changes. Initial labs revealed the presence of acute kidney injury as his creatinine trended up to 1.56 from 0.85 on previous lab work. Initial troponin mildly elevated at 0.25 although patient did not complain of chest pain. EKG revealed T-wave inversions. He was  found to have urinary tract infection. In all likelihood infectious process along with dehydration precipitated functional decline. He was initially started on IV ceftriaxone along with IV fluid resuscitation. Cardiology was consulted. Goals of care discussed with family as they did not wish for patient to undergo invasive/burdensome procedures. Agree with medical management. On the following day patient remains encephalopathic. Looking back at previous cultures it appears urine cultures had grown Pseudomonas for which ceftriaxone was discontinued and he was started on cefepime IV. Sepsis evidenced by acute encephalopathy, evidence of end organ damage with acute kidney injury, elevated troponins, white count of 18,300. Source of infection likely urinary tract infection. Unfortunately patient continued to decline despite IV antimicrobial therapy with cefepime. His kidney function worsened, white count continued to increase and patient becoming unresponsive. Goals of care discussed with family members as they wished to focus his care on comfort and quality of life rather than pursuing further invasive/burdensome procedures. They also wished to discontinue IV antimicrobial therapy in am provide further IV fluids. Patient expired on 04/22/2014 at 11:40 AM. Family members were present at bedside. I evaluated patient at bedside at time of death.   Time: 30 min  Signed:  Jeralyn BennettZAMORA, Tyvon Eggenberger  [facility] 05/20/2014, 12:39 PM

## 2014-05-22 NOTE — Progress Notes (Signed)
Patient to the morgue.

## 2014-05-22 DEATH — deceased

## 2014-05-24 LAB — CULTURE, BLOOD (ROUTINE X 2)
CULTURE: NO GROWTH
Culture: NO GROWTH

## 2014-06-22 DEATH — deceased

## 2016-05-16 IMAGING — CR DG CHEST 2V
2 series · 2 of 2 positions shown · non-contrast
Comparison: 01/28/2014

CLINICAL DATA: Altered mental status, intermittent cough

EXAM:
CHEST  2 VIEW

[chest lat]
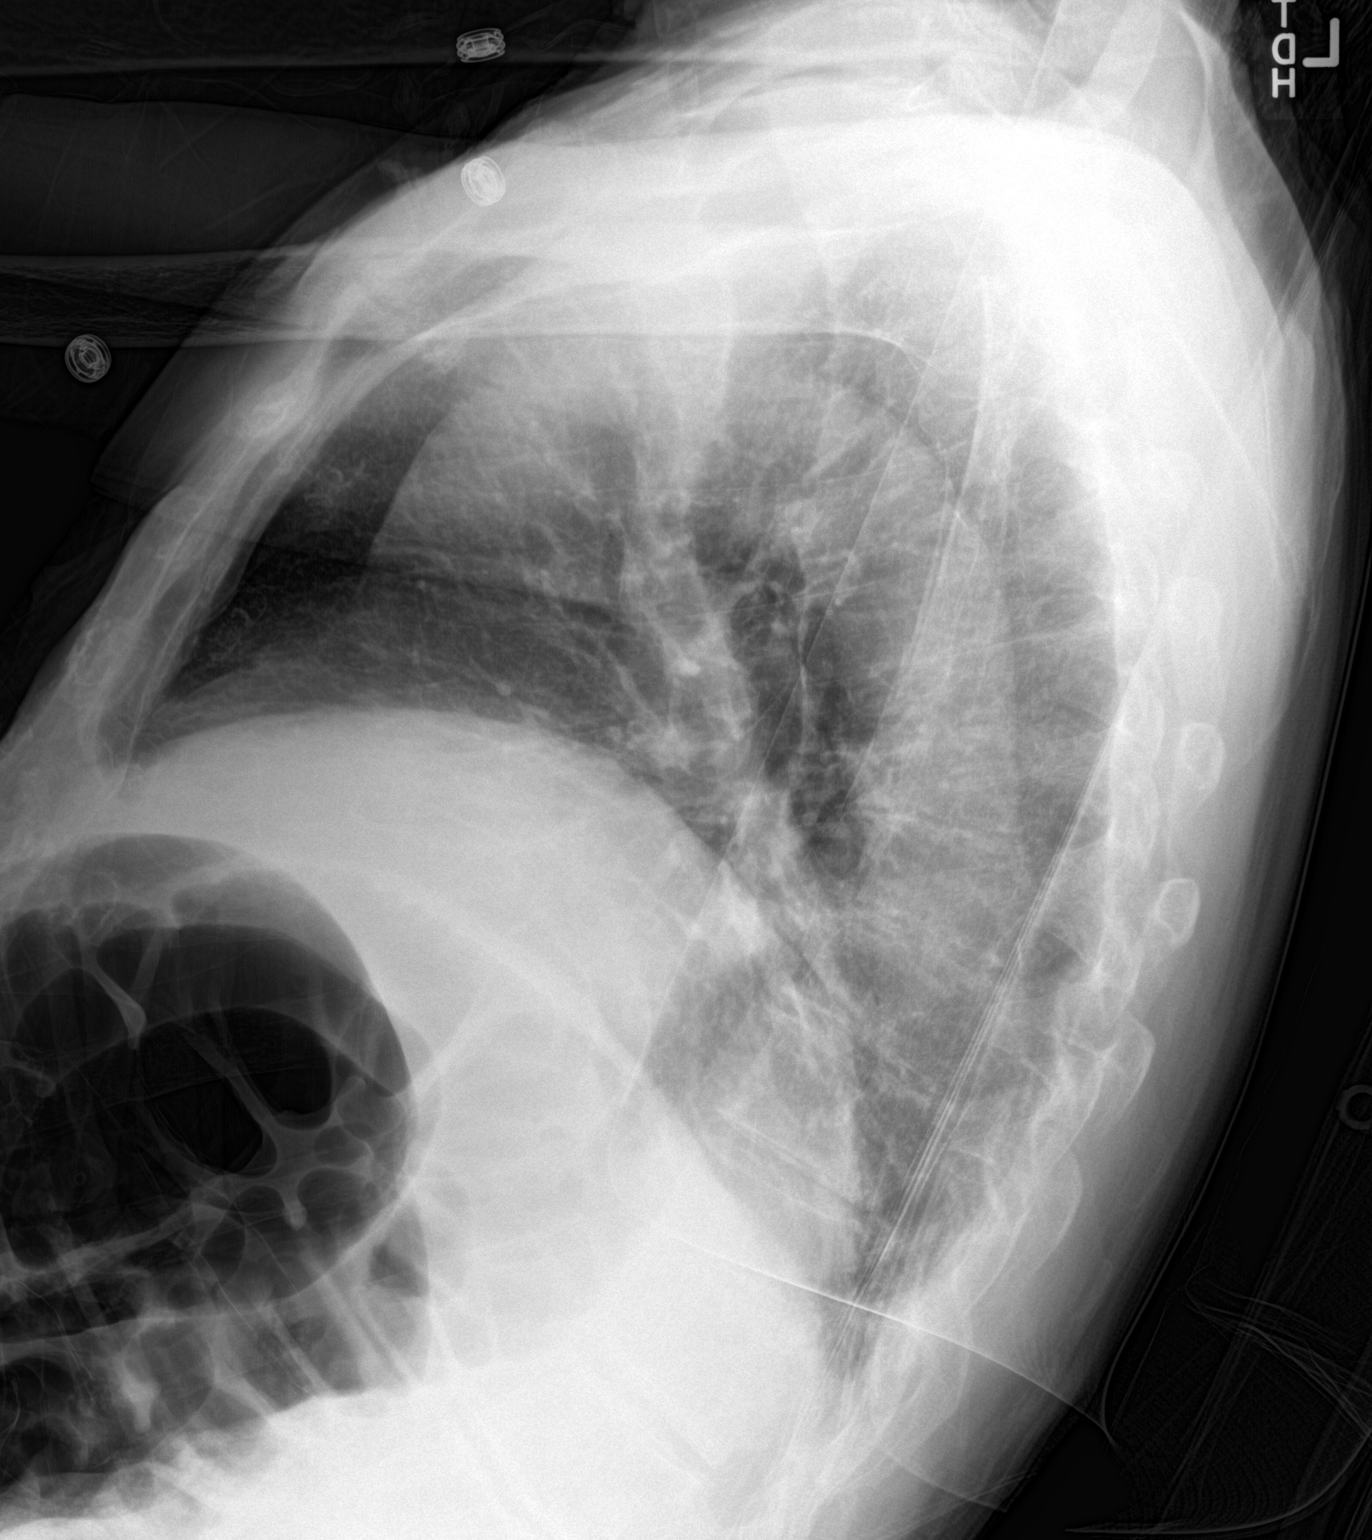

[chest ap]
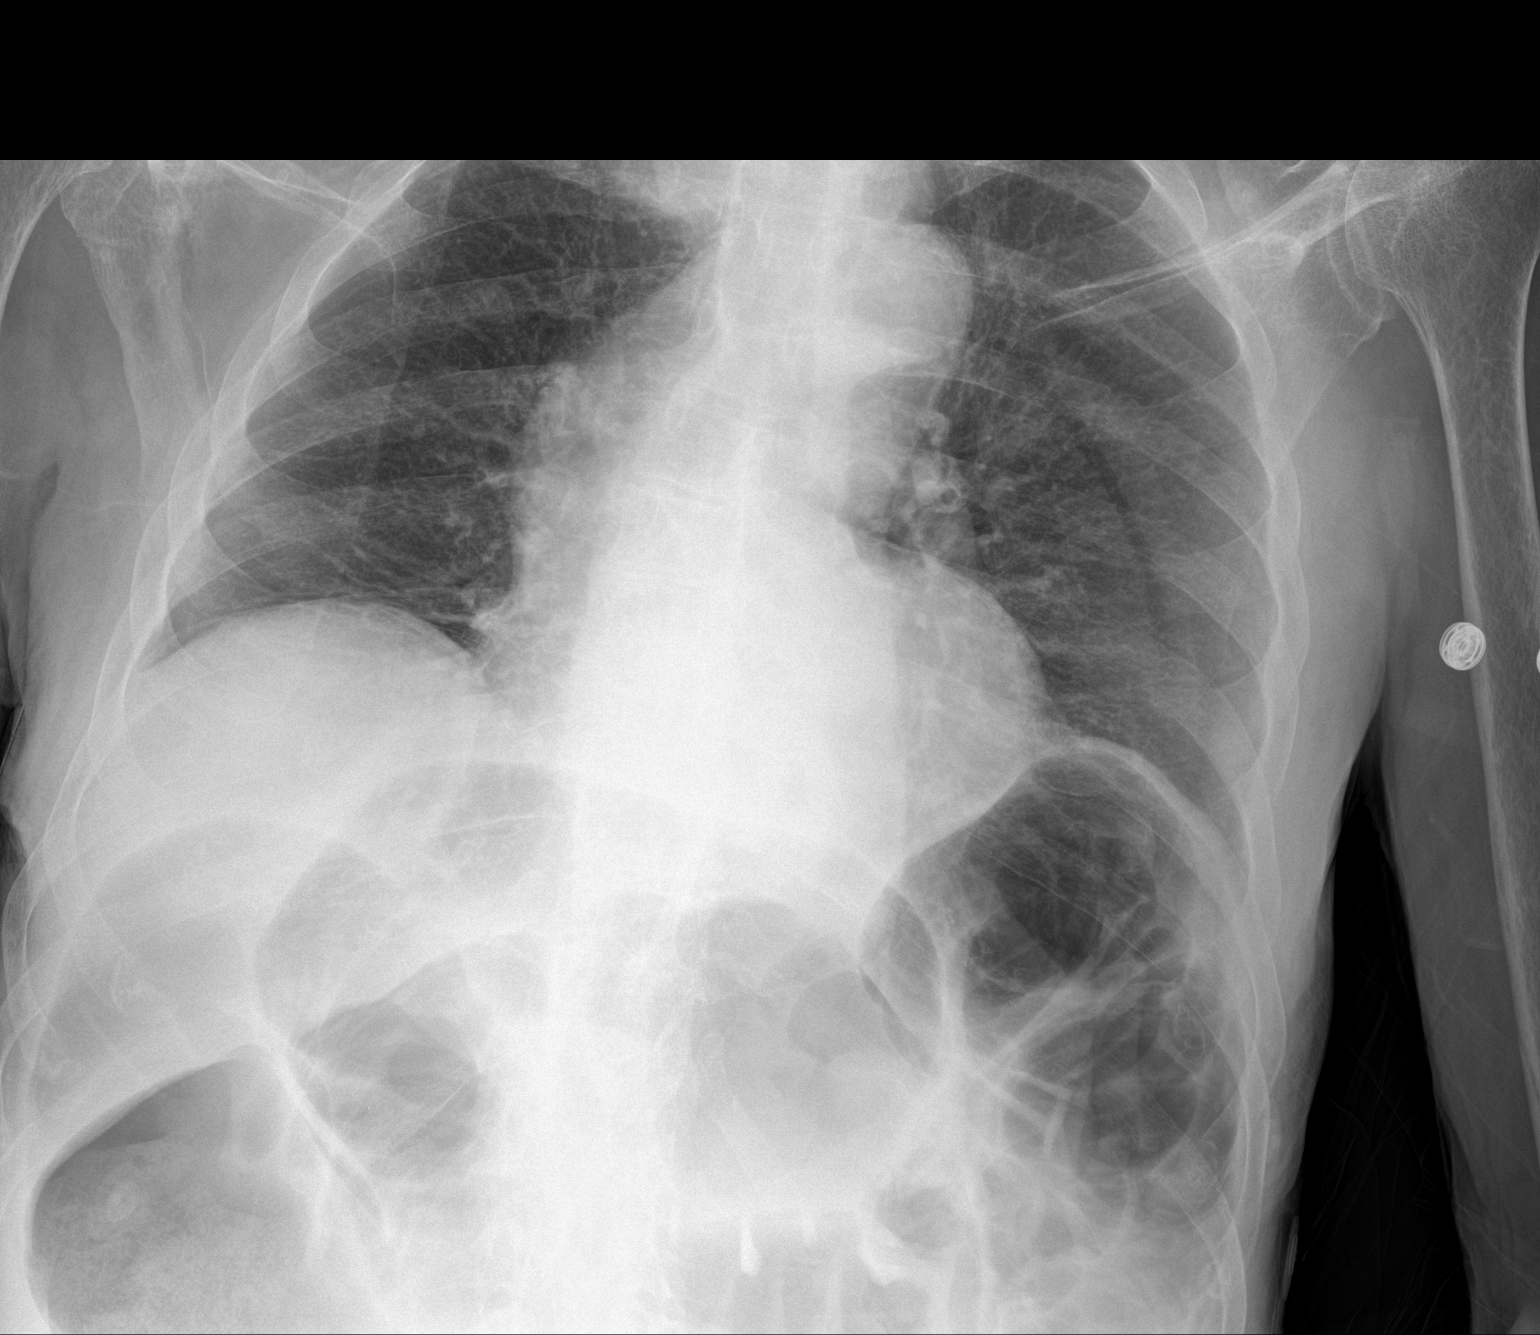

[2 of 2 positions shown; findings below may reference images not displayed]

FINDINGS: Cardiomediastinal silhouette is unremarkable. No acute infiltrate or
pleural effusion. No pulmonary edema. Mild degenerative changes
lower thoracic spine. Moderate gaseous distension of the colon.
IMPRESSION: No active cardiopulmonary disease. Moderate gaseous distention
visualized upper colon.
